# Patient Record
Sex: Female | Born: 1985 | Race: Black or African American | Hispanic: No | Marital: Single | Smoking: Never smoker
Health system: Southern US, Community
[De-identification: ages and names within clinical notes are randomized; demographics above are authoritative.]

## PROBLEM LIST (undated history)

## (undated) DIAGNOSIS — R87619 Unspecified abnormal cytological findings in specimens from cervix uteri: Secondary | ICD-10-CM

## (undated) DIAGNOSIS — R519 Headache, unspecified: Secondary | ICD-10-CM

## (undated) DIAGNOSIS — R011 Cardiac murmur, unspecified: Secondary | ICD-10-CM

## (undated) DIAGNOSIS — F419 Anxiety disorder, unspecified: Secondary | ICD-10-CM

## (undated) DIAGNOSIS — IMO0002 Reserved for concepts with insufficient information to code with codable children: Secondary | ICD-10-CM

## (undated) DIAGNOSIS — I10 Essential (primary) hypertension: Secondary | ICD-10-CM

## (undated) DIAGNOSIS — R51 Headache: Secondary | ICD-10-CM

## (undated) HISTORY — PX: WISDOM TOOTH EXTRACTION: SHX21

## (undated) HISTORY — PX: EYE SURGERY: SHX253

## (undated) HISTORY — DX: Unspecified abnormal cytological findings in specimens from cervix uteri: R87.619

## (undated) HISTORY — DX: Reserved for concepts with insufficient information to code with codable children: IMO0002

---

## 2012-11-03 ENCOUNTER — Other Ambulatory Visit: Payer: Self-pay | Admitting: Family Medicine

## 2012-11-03 ENCOUNTER — Encounter: Payer: Self-pay | Admitting: *Deleted

## 2012-11-03 ENCOUNTER — Emergency Department
Admission: EM | Admit: 2012-11-03 | Discharge: 2012-11-03 | Disposition: A | Discharge status: Home / Self Care | Payer: Self-pay | Source: Home / Self Care | Attending: Family Medicine | Admitting: Family Medicine

## 2012-11-03 DIAGNOSIS — Z331 Pregnant state, incidental: Secondary | ICD-10-CM

## 2012-11-03 DIAGNOSIS — N898 Other specified noninflammatory disorders of vagina: Secondary | ICD-10-CM

## 2012-11-03 HISTORY — DX: Essential (primary) hypertension: I10

## 2012-11-03 LAB — OB RESULTS CONSOLE GC/CHLAMYDIA
Chlamydia: NEGATIVE
Gonorrhea: NEGATIVE

## 2012-11-03 NOTE — ED Provider Notes (Signed)
CSN: [id number]     Arrival date & time 11/03/12  1215 History     First MD Initiated Contact with Patient 11/03/12 1250     Chief Complaint  Patient presents with  . Possible Pregnancy  . Vaginal Discharge      HPI Comments: Patient reports that she had a brief episode of lower abdominal pain about 3 weeks ago.  Over the past 3 weeks she has had a persistent vaginal discharge without pelvic pain.  She also recalls having had a tender nodule in her left labia that resolved.  She states that she has had persistent breast soreness for 3 weeks.  No urinary symptoms.  Patient's last menstrual period was 10/02/2012.   One week ago she developed mild nausea without vomiting, and states that she feels "full" in her lower abdomen.  Yesterday she noted a tinge of blood in her discharge now, resolved.  She performed two home pregnancy tests which were both positive yesterday. She is G2P2  Patient is a 27 y.o. female presenting with vaginal discharge. The history is provided by the patient.  Vaginal Discharge Quality:  White Severity:  Mild Onset quality:  Gradual Duration:  3 weeks Timing:  Constant Progression:  Unchanged Chronicity:  New Context: not recent antibiotic use   Relieved by:  Nothing Worsened by:  Nothing tried Ineffective treatments:  None tried Associated symptoms: nausea   Associated symptoms: no abdominal pain, no dysuria, no fever, no genital lesions, no rash, no urinary frequency, no urinary hesitancy, no urinary incontinence, no vaginal itching and no vomiting     Past Medical History  Diagnosis Date  . Hypertension    History reviewed. No pertinent past surgical history. Family History  Problem Relation Age of Onset  . Hypertension Father    History  Substance Use Topics  . Smoking status: Never Smoker   . Smokeless tobacco: Never Used  . Alcohol Use: No   OB History   Grav Para Term Preterm Abortions TAB SAB Ect Mult Living                 Review of  Systems  Constitutional: Negative for fever.  Gastrointestinal: Positive for nausea. Negative for vomiting and abdominal pain.  Genitourinary: Positive for vaginal discharge. Negative for bladder incontinence, dysuria and hesitancy.  All other systems reviewed and are negative.    Allergies  Review of patient's allergies indicates no known allergies.  Home Medications  No current outpatient prescriptions on file. BP 138/88  Pulse 94  Temp(Src) 98.2 F (36.8 C) (Oral)  Resp 16  Ht 5\' 3"  (1.6 m)  Wt 133 lb (60.328 kg)  BMI 23.57 kg/m2  SpO2 100%  LMP 10/02/2012 Physical Exam Nursing notes and Vital Signs reviewed. Appearance:  Patient appears healthy, stated age, and in no acute distress Eyes:  Pupils are equal, round, and reactive to light and accomodation.  Extraocular movement is intact.  Conjunctivae are not inflamed  Pharynx:  Normal Neck:  Supple.   No adenopathy Lungs:  Clear to auscultation.  Breath sounds are equal.  Heart:  Regular rate and rhythm without murmurs, rubs, or gallops.  Abdomen:  Nontender without masses or hepatosplenomegaly.  Bowel sounds are present.  No CVA or flank tenderness.  Note no pelvic tenderness Extremities:  No edema.  No calf tenderness Skin:  No rash present. Genitourinary:  Vulva appears normal without lesions or erythema.  Speculum exam without bimanual exam:  Vagina has normal mucosae without lesions and a  small amount of white discharge in the vaginal vault.  Cervix appears normal without lesions.  There is no discharge present in the cervical os.     ED Course   Procedures  none  Labs Reviewed  GC/CHLAMYDIA PROBE AMP, GENITAL pending  POCT KOH/wet prep:  Rare WBC, rare yeast, few clue cells, no trich, many epithelial cells  1. Vaginal discharge in pregnancy, first trimester     MDM  GC/chlamydia pending (specimen taken from vaginal vault in front of posterior fornix) Followup with Dr. Elsie Lincoln tomorrow. If symptoms become  significantly worse during the night or over the weekend, proceed to the local emergency room.   Lattie Haw, MD 11/03/12 1600

## 2012-11-03 NOTE — ED Notes (Signed)
Appt sch'ed with Dr. Penne Lash GYN for 11/04/12 @ 3:00pm. Pt notified of the appt/ [facility]

## 2012-11-03 NOTE — ED Notes (Signed)
Robin Erickson c/o pelvic pain 2 weeks ago that lasted for 2 weeks. Later developed a vaginal knot that has since resolved. She also c/o nausea and increased white vaginal discharge that was blood tinged yesterday. Taken 2 home pregnancy tests, both positive.

## 2012-11-04 ENCOUNTER — Encounter: Payer: Self-pay | Admitting: Obstetrics & Gynecology

## 2012-11-04 ENCOUNTER — Ambulatory Visit (INDEPENDENT_AMBULATORY_CARE_PROVIDER_SITE_OTHER): Payer: Medicaid Other | Admitting: Obstetrics & Gynecology

## 2012-11-04 VITALS — BP 127/92 | HR 88 | Resp 16 | Ht 63.0 in | Wt 133.0 lb

## 2012-11-04 DIAGNOSIS — Z331 Pregnant state, incidental: Secondary | ICD-10-CM | POA: Insufficient documentation

## 2012-11-04 DIAGNOSIS — Z3200 Encounter for pregnancy test, result unknown: Secondary | ICD-10-CM

## 2012-11-04 NOTE — Progress Notes (Signed)
  Subjective:    Patient ID: Vanna Scotland, female    DOB: 10-08-1985, 27 y.o.   MRN: [id number]  HPI 27 yo G3P2 female at approx 4 weeks 4 days EGA by LMP.   Pt has had pelvic cramping x 1 week with discharge.  Had some spotting yesterday and went to urgent care.  Cultures wer negative.  No work up regarding pain and pregnancy done.  Review of Systems    as above  Objective:   Physical Exam  Vitals reviewed. Constitutional: She appears well-developed and well-nourished.  HENT:  Head: Normocephalic and atraumatic.  Pulmonary/Chest: Effort normal.  Abdominal: Soft.  Genitourinary: Vagina normal and uterus normal.  No pain on bimanual, no adnexal mass  Musculoskeletal: She exhibits no edema.  Skin: Skin is warm and dry.  Psychiatric: She has a normal mood and affect.          Assessment & Plan:  27 yo female with mild pelvic pain approx 4 weeks   bhcg TV US

## 2012-11-05 ENCOUNTER — Ambulatory Visit (HOSPITAL_COMMUNITY)
Admission: RE | Admit: 2012-11-05 | Discharge: 2012-11-05 | Disposition: A | Discharge status: Home / Self Care | Payer: Self-pay | Source: Ambulatory Visit | Attending: Obstetrics & Gynecology | Admitting: Obstetrics & Gynecology

## 2012-11-05 ENCOUNTER — Telehealth: Payer: Self-pay | Admitting: *Deleted

## 2012-11-05 ENCOUNTER — Other Ambulatory Visit: Payer: Self-pay | Admitting: Obstetrics & Gynecology

## 2012-11-05 DIAGNOSIS — Z3689 Encounter for other specified antenatal screening: Secondary | ICD-10-CM | POA: Insufficient documentation

## 2012-11-05 DIAGNOSIS — Z3200 Encounter for pregnancy test, result unknown: Secondary | ICD-10-CM

## 2012-11-05 DIAGNOSIS — O209 Hemorrhage in early pregnancy, unspecified: Secondary | ICD-10-CM | POA: Insufficient documentation

## 2012-11-05 LAB — HCG, QUANTITATIVE, PREGNANCY: hCG, Beta Chain, Quant, S: 3755.7 m[IU]/mL

## 2012-11-05 NOTE — Telephone Encounter (Signed)
Spoke with patient and review BHCG levels.  She will repeat on Monday and she is being scheduled for a TVU to r/o ectopic pregnancy.

## 2012-11-08 ENCOUNTER — Other Ambulatory Visit (INDEPENDENT_AMBULATORY_CARE_PROVIDER_SITE_OTHER): Payer: Medicaid Other | Admitting: *Deleted

## 2012-11-08 ENCOUNTER — Telehealth: Payer: Self-pay | Admitting: *Deleted

## 2012-11-08 DIAGNOSIS — Z331 Pregnant state, incidental: Secondary | ICD-10-CM

## 2012-11-08 DIAGNOSIS — Z3200 Encounter for pregnancy test, result unknown: Secondary | ICD-10-CM

## 2012-11-08 NOTE — Telephone Encounter (Signed)
Message copied by Granville Lewis on Mon Nov 08, 2012  3:40 PM ------      Message from: Lesly Dukes      Created: Sun Nov 07, 2012  1:50 PM       Draw beta this Monday.  Let me know what it is.            thx sweet pea!            KHL ------

## 2012-11-08 NOTE — Telephone Encounter (Signed)
Pt notified  On Friday to have repeat BHCG today but pt did not show.  I called patient to remind her of her need for the repeat BHCG today.

## 2012-11-09 ENCOUNTER — Telehealth: Payer: Self-pay | Admitting: *Deleted

## 2012-11-09 DIAGNOSIS — Z3200 Encounter for pregnancy test, result unknown: Secondary | ICD-10-CM

## 2012-11-09 NOTE — Telephone Encounter (Signed)
Pt aware of BHCG results and per Dr Penne Lash repeat TVU in about 10 days.

## 2012-11-18 ENCOUNTER — Ambulatory Visit (HOSPITAL_COMMUNITY)
Admission: RE | Admit: 2012-11-18 | Discharge: 2012-11-18 | Disposition: A | Discharge status: Home / Self Care | Payer: Self-pay | Source: Ambulatory Visit | Attending: Obstetrics & Gynecology | Admitting: Obstetrics & Gynecology

## 2012-11-18 ENCOUNTER — Ambulatory Visit (HOSPITAL_COMMUNITY): Payer: Self-pay

## 2012-11-18 DIAGNOSIS — O3680X0 Pregnancy with inconclusive fetal viability, not applicable or unspecified: Secondary | ICD-10-CM | POA: Insufficient documentation

## 2012-11-18 DIAGNOSIS — Z3200 Encounter for pregnancy test, result unknown: Secondary | ICD-10-CM

## 2012-11-18 DIAGNOSIS — O209 Hemorrhage in early pregnancy, unspecified: Secondary | ICD-10-CM | POA: Insufficient documentation

## 2012-11-18 DIAGNOSIS — Z3689 Encounter for other specified antenatal screening: Secondary | ICD-10-CM | POA: Insufficient documentation

## 2012-12-17 ENCOUNTER — Encounter: Payer: Self-pay | Admitting: Advanced Practice Midwife

## 2012-12-17 ENCOUNTER — Ambulatory Visit (INDEPENDENT_AMBULATORY_CARE_PROVIDER_SITE_OTHER): Payer: Medicaid Other | Admitting: Advanced Practice Midwife

## 2012-12-17 VITALS — BP 144/84 | Wt 139.0 lb

## 2012-12-17 DIAGNOSIS — Z113 Encounter for screening for infections with a predominantly sexual mode of transmission: Secondary | ICD-10-CM

## 2012-12-17 DIAGNOSIS — O09899 Supervision of other high risk pregnancies, unspecified trimester: Secondary | ICD-10-CM | POA: Insufficient documentation

## 2012-12-17 DIAGNOSIS — Z1151 Encounter for screening for human papillomavirus (HPV): Secondary | ICD-10-CM

## 2012-12-17 DIAGNOSIS — Z1379 Encounter for other screening for genetic and chromosomal anomalies: Secondary | ICD-10-CM

## 2012-12-17 DIAGNOSIS — Z124 Encounter for screening for malignant neoplasm of cervix: Secondary | ICD-10-CM

## 2012-12-17 DIAGNOSIS — O09891 Supervision of other high risk pregnancies, first trimester: Secondary | ICD-10-CM

## 2012-12-17 DIAGNOSIS — Z23 Encounter for immunization: Secondary | ICD-10-CM

## 2012-12-17 DIAGNOSIS — O10919 Unspecified pre-existing hypertension complicating pregnancy, unspecified trimester: Secondary | ICD-10-CM | POA: Insufficient documentation

## 2012-12-17 DIAGNOSIS — O10911 Unspecified pre-existing hypertension complicating pregnancy, first trimester: Secondary | ICD-10-CM

## 2012-12-17 DIAGNOSIS — O210 Mild hyperemesis gravidarum: Secondary | ICD-10-CM

## 2012-12-17 DIAGNOSIS — G43009 Migraine without aura, not intractable, without status migrainosus: Secondary | ICD-10-CM | POA: Insufficient documentation

## 2012-12-17 DIAGNOSIS — O219 Vomiting of pregnancy, unspecified: Secondary | ICD-10-CM

## 2012-12-17 DIAGNOSIS — O10019 Pre-existing essential hypertension complicating pregnancy, unspecified trimester: Secondary | ICD-10-CM

## 2012-12-17 MED ORDER — LABETALOL HCL 200 MG PO TABS: 100.0000 mg | ORAL_TABLET | Freq: Two times a day (BID) | ORAL | Status: DC

## 2012-12-17 MED ORDER — PROMETHAZINE HCL 25 MG PO TABS: 12.5000 mg | ORAL_TABLET | Freq: Four times a day (QID) | ORAL | Status: DC | PRN

## 2012-12-17 MED ORDER — SUMATRIPTAN SUCCINATE 100 MG PO TABS: 100.0000 mg | ORAL_TABLET | Freq: Once | ORAL | Status: DC | PRN

## 2012-12-17 MED ORDER — INFLUENZA VAC SPLIT QUAD 0.5 ML IM SUSP
0.5000 mL | Freq: Once | INTRAMUSCULAR | Status: AC
  Administered 2012-12-17: 0.5 mL via INTRAMUSCULAR

## 2012-12-17 NOTE — Progress Notes (Signed)
p-88 

## 2012-12-17 NOTE — Progress Notes (Signed)
Subjective:    Robin Erickson is being seen today for her first obstetrical visit.  This is a planned pregnancy. She is at [redacted]w[redacted]d gestation. Her obstetrical history is significant for chronic hypertension. No Dx of Pre-eclampsia, but states she was induced at term for high BP. Patient does intend to breast feed. Pregnancy history fully reviewed.   Hx CHTN. Had been on HCTZ. Stopped for pregnancy.   Husband concerned about affect of his anti rejection medications on pregnancy.   Has had migraine before and during this pregnancy. Seen in [facility] for one a few weeks ago. Usually take Excedrin w/ good results. Told to stop during pregnancy. Triggered by and sometimes accompanied by N/V. Has Zofran Rx, but not covered. (?) Wants antiemetic.   Patient reports nausea, no bleeding, no contractions, no cramping and no leaking.  Migraines every few weeks. None now.   Review of Systems:   Review of Systems: Otherwise Neg  Objective:     BP 157/92  Wt 139 lb (63.05 kg)  BMI 24.63 kg/m2  LMP 10/02/2012 144/84 Physical Exam  Exam Physical Examination: General appearance - alert, well appearing, and in no distress, oriented to person, place, and time and normal appearing weight Mental status - normal mood, behavior, speech, dress, motor activity, and thought processes Eyes - pupils equal and reactive, extraocular eye movements intact, sclera anicteric Mouth - mucous membranes moist, pharynx normal without lesions and dental hygiene good Neck - supple, no significant adenopathy, thyroid exam: thyroid is normal in size without nodules or tenderness Heart - normal rate, regular rhythm, normal S1, S2, no murmurs, rubs, clicks or gallops Abdomen - soft, nontender, nondistended, no masses or organomegaly Pelvic - normal external genitalia, vulva, vagina, cervix, uterus and adnexa, PAP: Pap smear done today Neurological - alert, oriented, normal speech, no focal findings or movement disorder  noted Extremities - no pedal edema noted, Homan's sign negative bilaterally     Assessment:    Pregnancy: G3P2002  Migraine without aura - Plan: SUMAtriptan (IMITREX) 100 MG tablet, Cytology - PAP, Prenatal (OB Panel), HIV Antibody ( Reflex), Sickle Cell Scr, CULTURE, URINE COMPREHENSIVE, influenza vac split quadrivalent PF (FLUARIX) injection 0.5 mL, Prescript Monitor Profile(19), Alcohol metabolite (ETG), urine  Nausea and vomiting in pregnancy prior to [redacted] weeks gestation - Plan: promethazine (PHENERGAN) 25 MG tablet, Cytology - PAP, Prenatal (OB Panel), HIV Antibody ( Reflex), Sickle Cell Scr, CULTURE, URINE COMPREHENSIVE, influenza vac split quadrivalent PF (FLUARIX) injection 0.5 mL, Prescript Monitor Profile(19), Alcohol metabolite (ETG), urine  Genetic screening - Plan: US Fetal Nuchal Translucency Measurement, Genetic counseling, Cytology - PAP, Prenatal (OB Panel), HIV Antibody ( Reflex), Sickle Cell Scr, CULTURE, URINE COMPREHENSIVE, influenza vac split quadrivalent PF (FLUARIX) injection 0.5 mL, Prescript Monitor Profile(19), Alcohol metabolite (ETG), urine  Chronic hypertension in pregnancy, first trimester - Plan: labetalol (NORMODYNE) 100 MG tablet BID.   Plan:     Initial labs drawn. Prenatal vitamins. Problem list reviewed and updated. AFP3 discussed: requested. Role of ultrasound in pregnancy discussed; fetal survey: requested. Amniocentesis discussed: not indicated. Follow up in 1 week for BP check and 4 weeks for ROB. 75% of 30 min visit spent on counseling and coordination of care.  Needs baseline 24 hour urine.  Discussed antenatal testing, CNM/MD care choices.    Referred to [facility] for Migraine management. Informed pt that it is highly unlikely that Husband's meds would affect her pregnancy, but offered Genetic Counseling to discuss. Will decide.   Dorathy Kinsman 12/17/2012

## 2012-12-17 NOTE — Patient Instructions (Addendum)

## 2012-12-18 LAB — OBSTETRIC PANEL
Antibody Screen: NEGATIVE
Basophils Absolute: 0 10*3/uL (ref 0.0–0.1)
Basophils Relative: 0 % (ref 0–1)
Eosinophils Relative: 1 % (ref 0–5)
Hemoglobin: 12.4 g/dL (ref 12.0–15.0)
Lymphocytes Relative: 20 % (ref 12–46)
MCH: 31.1 pg (ref 26.0–34.0)
Monocytes Absolute: 0.8 10*3/uL (ref 0.1–1.0)
Neutro Abs: 11.2 10*3/uL — ABNORMAL HIGH (ref 1.7–7.7)
RBC: 3.99 MIL/uL (ref 3.87–5.11)
Rubella: 1.32 Index — ABNORMAL HIGH (ref <0.90)

## 2012-12-18 LAB — HIV ANTIBODY (ROUTINE TESTING W REFLEX): HIV: NONREACTIVE

## 2012-12-18 LAB — SICKLE CELL SCREEN: Sickle Cell Screen: NEGATIVE

## 2012-12-19 LAB — CULTURE, URINE COMPREHENSIVE
Colony Count: NO GROWTH
Organism ID, Bacteria: NO GROWTH

## 2012-12-20 LAB — PRESCRIPTION MONITORING PROFILE (19 PANEL)
Buprenorphine, Urine: NEGATIVE ng/mL
Cannabinoid Scrn, Ur: NEGATIVE ng/mL
Carisoprodol, Urine: NEGATIVE ng/mL
Cocaine Metabolites: NEGATIVE ng/mL
Creatinine, Urine: 103.4 mg/dL (ref >20.0)
Fentanyl, Ur: NEGATIVE ng/mL
MDMA URINE: NEGATIVE ng/mL
Meperidine, Ur: NEGATIVE ng/mL
Methadone Screen, Urine: NEGATIVE ng/mL
Methaqualone: NEGATIVE ng/mL
Oxycodone Screen, Ur: NEGATIVE ng/mL
Phencyclidine, Ur: NEGATIVE ng/mL
Tapentadol, urine: NEGATIVE ng/mL

## 2012-12-21 ENCOUNTER — Encounter: Payer: Self-pay | Admitting: Advanced Practice Midwife

## 2012-12-31 ENCOUNTER — Encounter (HOSPITAL_COMMUNITY): Payer: Self-pay

## 2012-12-31 ENCOUNTER — Other Ambulatory Visit: Payer: Self-pay

## 2012-12-31 ENCOUNTER — Ambulatory Visit (HOSPITAL_COMMUNITY)
Admission: RE | Admit: 2012-12-31 | Discharge: 2012-12-31 | Disposition: A | Discharge status: Home / Self Care | Payer: Medicaid Other | Source: Ambulatory Visit | Attending: Advanced Practice Midwife | Admitting: Advanced Practice Midwife

## 2012-12-31 ENCOUNTER — Ambulatory Visit (HOSPITAL_COMMUNITY)
Admission: RE | Admit: 2012-12-31 | Discharge: 2012-12-31 | Disposition: A | Discharge status: Home / Self Care | Payer: Medicaid Other | Source: Ambulatory Visit | Attending: Obstetrics and Gynecology | Admitting: Obstetrics and Gynecology

## 2012-12-31 DIAGNOSIS — Z1379 Encounter for other screening for genetic and chromosomal anomalies: Secondary | ICD-10-CM

## 2012-12-31 DIAGNOSIS — O3510X0 Maternal care for (suspected) chromosomal abnormality in fetus, unspecified, not applicable or unspecified: Secondary | ICD-10-CM | POA: Insufficient documentation

## 2012-12-31 DIAGNOSIS — O10019 Pre-existing essential hypertension complicating pregnancy, unspecified trimester: Secondary | ICD-10-CM | POA: Insufficient documentation

## 2012-12-31 DIAGNOSIS — Z3689 Encounter for other specified antenatal screening: Secondary | ICD-10-CM | POA: Insufficient documentation

## 2012-12-31 DIAGNOSIS — O351XX0 Maternal care for (suspected) chromosomal abnormality in fetus, not applicable or unspecified: Secondary | ICD-10-CM | POA: Insufficient documentation

## 2012-12-31 NOTE — Progress Notes (Signed)
Robin Erickson  was seen today for an ultrasound appointment.  See full report in [facility].  Impression: Single IUP at 12 6/7 weeks Normal NT (1.4 mm)  Nasal bone visualized First trimester aneuploidy screen performed as noted above.     Recommendations: Please do not draw triple/quad screen, though patient should be offered MSAFP for neural tube defect screening Recommend follow up in 6 weeks for fetal anatomy.  Alpha Gula, MD

## 2013-01-04 ENCOUNTER — Encounter: Payer: Self-pay | Admitting: Nurse Practitioner

## 2013-01-04 ENCOUNTER — Ambulatory Visit (INDEPENDENT_AMBULATORY_CARE_PROVIDER_SITE_OTHER): Payer: Medicaid Other | Admitting: Nurse Practitioner

## 2013-01-04 VITALS — BP 130/88 | Ht 63.0 in | Wt 144.0 lb

## 2013-01-04 DIAGNOSIS — O10019 Pre-existing essential hypertension complicating pregnancy, unspecified trimester: Secondary | ICD-10-CM

## 2013-01-04 DIAGNOSIS — O10911 Unspecified pre-existing hypertension complicating pregnancy, first trimester: Secondary | ICD-10-CM

## 2013-01-04 DIAGNOSIS — G43009 Migraine without aura, not intractable, without status migrainosus: Secondary | ICD-10-CM

## 2013-01-04 MED ORDER — ACETAMINOPHEN-CODEINE #3 300-30 MG PO TABS: 1.0000 | ORAL_TABLET | ORAL | Status: DC | PRN

## 2013-01-04 MED ORDER — PROMETHAZINE HCL 25 MG PO TABS: 25.0000 mg | ORAL_TABLET | Freq: Four times a day (QID) | ORAL | Status: DC | PRN

## 2013-01-04 NOTE — Progress Notes (Signed)
Diagnosis: Migraine without Aura  History: Robin Erickson 27 y.o. [id number] comes to [facility] office for migraine consultation.She is 13 weeks and 3 days pregnant. She has had migraine since she was 27 years old. Things have gotten worse in the last 5 years they have become more frequent. She is not working, has a 28 year old, 28 year old and this is an unexpected pregnancy. Money seems to be an issue. When she was not pregnant she could take an excedrin and it would make the headache go away. She has been seen in office and given Imitrex, phenergan and labetalol and not gotten any of these filled due to lack of insurance. She has chronic HTN.      Location: Can be either temple  Number of Headache days/month: Severe: 3 Moderate:2 Mild:0  Current Outpatient Prescriptions on File Prior to Visit  Medication Sig Dispense Refill  . labetalol (NORMODYNE) 200 MG tablet Take 0.5 tablets (100 mg total) by mouth 2 (two) times daily.  60 tablet  3  . SUMAtriptan (IMITREX) 100 MG tablet Take 1 tablet (100 mg total) by mouth once as needed for migraine. May repeat in 2 hours if headache persists or recurs.  10 tablet  2   No current facility-administered medications on file prior to visit.    Acute/ prevention: Excedrin ( not while pregnant ), tylenol  Past Medical History  Diagnosis Date  . Hypertension   . Abnormal Pap smear    Past Surgical History  Procedure Laterality Date  . Wisdom tooth extraction    . Eye surgery      age 89   Family History  Problem Relation Age of Onset  . Hypertension Father   . Diabetes Maternal Aunt   . Hypertension Mother   . Glaucoma Maternal Grandmother    Social History:  reports that she has never smoked. She has never used smokeless tobacco. She reports that she does not drink alcohol or use illicit drugs. Allergies: No Known Allergies  Triggers: Stress  Birth control: Pregnant  ROS: positive for migraine, nausea, knee pain, chronic hypertension    Exam: well developed, well nourished AA female  General: Pregnant/ NAD HEENT: negative Cardiac: RRR Lungs: Clear Neuro:Negative Skin: warm and dry  Impression:migraine - common  Plan: Discussed the pathophysiology of migraine and risks and benefits of medications in pregnancy. She is willing to assume risks. She was seen in office prior by [place] [facility] and given labetalol, Imitrex and phenergan. She did not get any of these filled as her insurance has not kicked in yet. It was explained these are likely $4 and she should get BP meds filled asap. Will add tylenol #3 until she can afford to get Imitrex filled.  She can return if this plan does not work out, or things get worse   Time Spent: 30 min

## 2013-01-04 NOTE — Patient Instructions (Signed)
Migraine Headache A migraine headache is an intense, throbbing pain on one or both sides of your head. A migraine can last for 30 minutes to several hours. CAUSES  The exact cause of a migraine headache is not always known. However, a migraine may be caused when nerves in the brain become irritated and release chemicals that cause inflammation. This causes pain. SYMPTOMS  Pain on one or both sides of your head.  Pulsating or throbbing pain.  Severe pain that prevents daily activities.  Pain that is aggravated by any physical activity.  Nausea, vomiting, or both.  Dizziness.  Pain with exposure to bright lights, loud noises, or activity.  General sensitivity to bright lights, loud noises, or smells. Before you get a migraine, you may get warning signs that a migraine is coming (aura). An aura may include:  Seeing flashing lights.  Seeing bright spots, halos, or zig-zag lines.  Having tunnel vision or blurred vision.  Having feelings of numbness or tingling.  Having trouble talking.  Having muscle weakness. MIGRAINE TRIGGERS  Alcohol.  Smoking.  Stress.  Menstruation.  Aged cheeses.  Foods or drinks that contain nitrates, glutamate, aspartame, or tyramine.  Lack of sleep.  Chocolate.  Caffeine.  Hunger.  Physical exertion.  Fatigue.  Medicines used to treat chest pain (nitroglycerine), birth control pills, estrogen, and some blood pressure medicines. DIAGNOSIS  A migraine headache is often diagnosed based on:  Symptoms.  Physical examination.  A CT scan or MRI of your head. TREATMENT Medicines may be given for pain and nausea. Medicines can also be given to help prevent recurrent migraines.  HOME CARE INSTRUCTIONS  Only take over-the-counter or prescription medicines for pain or discomfort as directed by your caregiver. The use of long-term narcotics is not recommended.  Lie down in a dark, quiet room when you have a migraine.  Keep a journal  to find out what may trigger your migraine headaches. For example, write down:  What you eat and drink.  How much sleep you get.  Any change to your diet or medicines.  Limit alcohol consumption.  Quit smoking if you smoke.  Get 7 to 9 hours of sleep, or as recommended by your caregiver.  Limit stress.  Keep lights dim if bright lights bother you and make your migraines worse. SEEK IMMEDIATE MEDICAL CARE IF:   Your migraine becomes severe.  You have a fever.  You have a stiff neck.  You have vision loss.  You have muscular weakness or loss of muscle control.  You start losing your balance or have trouble walking.  You feel faint or pass out.  You have severe symptoms that are different from your first symptoms. MAKE SURE YOU:   Understand these instructions.  Will watch your condition.  Will get help right away if you are not doing well or get worse. Document Released: 03/10/2005 Document Revised: 06/02/2011 Document Reviewed: 02/28/2011 [employer] Patient Information 2014 [employer].  

## 2013-01-11 ENCOUNTER — Encounter: Payer: Self-pay | Admitting: Obstetrics & Gynecology

## 2013-01-11 ENCOUNTER — Encounter: Payer: Self-pay | Admitting: *Deleted

## 2013-01-14 ENCOUNTER — Ambulatory Visit (INDEPENDENT_AMBULATORY_CARE_PROVIDER_SITE_OTHER): Payer: Medicaid Other | Admitting: Family

## 2013-01-14 VITALS — BP 140/90 | Wt 146.0 lb

## 2013-01-14 DIAGNOSIS — O09899 Supervision of other high risk pregnancies, unspecified trimester: Secondary | ICD-10-CM

## 2013-01-14 NOTE — Progress Notes (Signed)
p-88 

## 2013-01-14 NOTE — Progress Notes (Signed)
No questions or concerns; reviewed lab results, will return in two weeks for AFP, schedule anatomy US in 4 weeks.

## 2013-02-03 ENCOUNTER — Other Ambulatory Visit: Payer: Medicaid Other

## 2013-02-04 ENCOUNTER — Ambulatory Visit (INDEPENDENT_AMBULATORY_CARE_PROVIDER_SITE_OTHER): Payer: Medicaid Other | Admitting: Family

## 2013-02-04 VITALS — BP 144/101 | Temp 97.7°F | Wt 149.0 lb

## 2013-02-04 DIAGNOSIS — O139 Gestational [pregnancy-induced] hypertension without significant proteinuria, unspecified trimester: Secondary | ICD-10-CM

## 2013-02-04 DIAGNOSIS — J019 Acute sinusitis, unspecified: Secondary | ICD-10-CM

## 2013-02-04 DIAGNOSIS — O09899 Supervision of other high risk pregnancies, unspecified trimester: Secondary | ICD-10-CM

## 2013-02-04 DIAGNOSIS — O0990 Supervision of high risk pregnancy, unspecified, unspecified trimester: Secondary | ICD-10-CM

## 2013-02-04 NOTE — Progress Notes (Signed)
p-116  Feels like possible sinus infection since Wednesday but has not taken any OTC meds

## 2013-02-04 NOTE — Progress Notes (Signed)
Report right maxillary sinus pressure.  No fever, coughing and fever last week that has resolved.  Exam:  Frontal sinuses neg with palpation, right maxillary tender with palp. Throat - slightly red, no exudate.  Lungs CTAB. RX Amoxicillin 500 mg TID x 7 days.  AFP collected today.  Consulted with Dr. Jolayne Panther regarding elevated blood pressure > increase labetalol to tid > return for follow-up in two weeks.  Anatomy ultrasound scheduled.

## 2013-02-11 ENCOUNTER — Ambulatory Visit (HOSPITAL_COMMUNITY): Admission: RE | Admit: 2013-02-11 | Payer: Medicaid Other | Source: Ambulatory Visit

## 2013-02-11 ENCOUNTER — Ambulatory Visit (HOSPITAL_COMMUNITY)
Admission: RE | Admit: 2013-02-11 | Discharge: 2013-02-11 | Disposition: A | Discharge status: Home / Self Care | Payer: Medicaid Other | Source: Ambulatory Visit | Attending: Family | Admitting: Family

## 2013-02-11 DIAGNOSIS — Z3689 Encounter for other specified antenatal screening: Secondary | ICD-10-CM | POA: Insufficient documentation

## 2013-02-11 DIAGNOSIS — O09899 Supervision of other high risk pregnancies, unspecified trimester: Secondary | ICD-10-CM

## 2013-02-12 ENCOUNTER — Encounter: Payer: Self-pay | Admitting: Family

## 2013-02-16 ENCOUNTER — Encounter: Payer: Self-pay | Admitting: Family

## 2013-02-21 ENCOUNTER — Ambulatory Visit (INDEPENDENT_AMBULATORY_CARE_PROVIDER_SITE_OTHER): Payer: Medicaid Other | Admitting: Advanced Practice Midwife

## 2013-02-21 VITALS — BP 127/83 | Wt 154.0 lb

## 2013-02-21 DIAGNOSIS — O10912 Unspecified pre-existing hypertension complicating pregnancy, second trimester: Secondary | ICD-10-CM

## 2013-02-21 DIAGNOSIS — O09899 Supervision of other high risk pregnancies, unspecified trimester: Secondary | ICD-10-CM

## 2013-02-21 DIAGNOSIS — O09892 Supervision of other high risk pregnancies, second trimester: Secondary | ICD-10-CM

## 2013-02-21 DIAGNOSIS — O10019 Pre-existing essential hypertension complicating pregnancy, unspecified trimester: Secondary | ICD-10-CM

## 2013-02-21 NOTE — Progress Notes (Signed)
p-98 

## 2013-02-21 NOTE — Patient Instructions (Signed)
Hypertension During Pregnancy Hypertension is also called high blood pressure. It can occur at any time in life and during pregnancy. When you have hypertension, there is extra pressure inside your blood vessels that carry blood from the heart to the rest of your body (arteries). Hypertension during pregnancy can cause problems for you and your baby. Your baby might not weigh as much as it should at birth or might be born early (premature). Very bad cases of hypertension during pregnancy can be life-threatening.  There are different types of hypertension during pregnancy.   Chronic hypertension. This happens when a woman has hypertension before pregnancy and it continues during pregnancy.  Gestational hypertension. This is when hypertension develops during pregnancy.  Preeclampsia or toxemia of pregnancy. This is a very serious type of hypertension that develops only during pregnancy. It is a disease that affects the whole body (systemic) and can be very dangerous for both mother and baby.  Gestational hypertension and preeclampsia usually go away after your baby is born. Blood pressure generally stabilizes within 6 weeks. Women who have hypertension during pregnancy have a greater chance of developing hypertension later in life or with future pregnancies. UNDERSTANDING BLOOD PRESSURE Blood pressure moves blood in your body. Sometimes, the force that moves the blood becomes too strong.  A blood pressure reading is given in 2 numbers and looks like a fraction.  The top number is called the systolic pressure. When your heart beats, it forces more blood to flow through the arteries. Pressure inside the arteries goes up.  The bottom number is the diastolic pressure. Pressure goes down between beats. That is when the heart is resting.  You may have hypertension if:  Your systolic blood pressure is above 140.  Your diastolic pressure is above 90. RISK FACTORS Some factors make you more likely to  develop hypertension during pregnancy. Risk factors include:  Having hypertension before pregnancy.  Having hypertension during a previous pregnancy.  Being overweight.  Being older than 40.  Being pregnant with more than 1 baby (multiples).  Having diabetes or kidney problems. SYMPTOMS Chronic and gestational hypertension may not cause symptoms. Preeclampsia has symptoms, which may include:  Increased protein in your urine. Your caregiver will check for this at every prenatal visit.  Swelling of your hands and face.  Rapid weight gain.  Headaches.  Visual changes.  Being bothered by light.  Abdominal pain, especially in the right upper area.  Chest pain.  Shortness of breath.  Increased reflexes.  Seizures. Seizures occur with a more severe form of preeclampsia, called eclampsia. DIAGNOSIS   You may be diagnosed with hypertension during pregnancy during a regular prenatal exam. At each visit, tests may include:  Blood pressure checks.  A urine test to check for protein in your urine.  The type of hypertension you are diagnosed with depends on when you developed it. It also depends on your specific blood pressure reading.  Developing hypertension before 20 weeks of pregnancy is consistent with chronic hypertension.  Developing hypertension after 20 weeks of pregnancy is consistent with gestational hypertension.  Hypertension with increased urinary protein is diagnosed as preeclampsia.  Blood pressure measurements that stay above 160 systolic or 110 diastolic are a sign of severe preeclampsia. TREATMENT Treatment for hypertension during pregnancy varies. Treatment depends on the type of hypertension and how serious it is.  If you take medicine for chronic hypertension, you may need to switch medicines.  Drugs called ACE inhibitors should not be taken during pregnancy.    Low-dose aspirin may be suggested for women who have risk factors for preeclampsia.  If  you have gestational hypertension, you may need to take a blood pressure medicine that is safe during pregnancy. Your caregiver will recommend the appropriate medicine.  If you have severe preeclampsia, you may need to be in the hospital. Caregivers will watch you and the baby very closely. You also may need to take medicine (magnesium sulfate) to prevent seizures and lower blood pressure.  Sometimes an early delivery is needed. This may be the case if the condition worsens. It would be done to protect you and the baby. The only cure for preeclampsia is delivery. HOME CARE INSTRUCTIONS  Schedule and keep all of your regular prenatal care.  Follow your caregiver's instructions for taking medicines. Tell your caregiver about all medicines you take. This includes over-the-counter medicines.  Eat as little salt as possible.  Get regular exercise.  Do not drink alcohol.  Do not use tobacco products.  Do not drink products with caffeine.  Lie on your left side when resting.  Tell your doctor if you have any preeclampsia symptoms. SEEK IMMEDIATE MEDICAL CARE IF:  You have severe abdominal pain.  You have sudden swelling in the hands, ankles, or face.  You gain 4 pounds (1.8 kg) or more in 1 week.  You vomit repeatedly.  You have vaginal bleeding.  You do not feel the baby moving as much.  You have a headache.  You have blurred or double vision.  You have muscle twitching or spasms.  You have shortness of breath.  You have blue fingernails and lips.  You have blood in your urine. MAKE SURE YOU:  Understand these instructions.  Will watch your condition.  Will get help right away if you are not doing well. Document Released: 11/26/2010 Document Revised: 06/02/2011 Document Reviewed: 11/26/2010 [facility] Patient Information 2014 [place], [place].  Breastfeeding Deciding to breastfeed is one of the best choices you can make for you and your baby. A change in hormones  during pregnancy causes your breast tissue to grow and increases the number and size of your milk ducts. These hormones also allow proteins, sugars, and fats from your blood supply to make breast milk in your milk-producing glands. Hormones prevent breast milk from being released before your baby is born as well as prompt milk flow after birth. Once breastfeeding has begun, thoughts of your baby, as well as his or her sucking or crying, can stimulate the release of milk from your milk-producing glands.  BENEFITS OF BREASTFEEDING For Your Baby  Your first milk (colostrum) helps your baby's digestive system function better.   There are antibodies in your milk that help your baby fight off infections.   Your baby has a lower incidence of asthma, allergies, and sudden infant death syndrome.   The nutrients in breast milk are better for your baby than infant formulas and are designed uniquely for your baby's needs.   Breast milk improves your baby's brain development.   Your baby is less likely to develop other conditions, such as childhood obesity, asthma, or type 2 diabetes mellitus.  For You   Breastfeeding helps to create a very special bond between you and your baby.   Breastfeeding is convenient. Breast milk is always available at the correct temperature and costs nothing.   Breastfeeding helps to burn calories and helps you lose the weight gained during pregnancy.   Breastfeeding makes your uterus contract to its prepregnancy size faster and  slows bleeding (lochia) after you give birth.   Breastfeeding helps to lower your risk of developing type 2 diabetes mellitus, osteoporosis, and breast or ovarian cancer later in life. SIGNS THAT YOUR BABY IS HUNGRY Early Signs of Hunger  Increased alertness or activity.  Stretching.  Movement of the head from side to side.  Movement of the head and opening of the mouth when the corner of the mouth or cheek is stroked  (rooting).  Increased sucking sounds, smacking lips, cooing, sighing, or squeaking.  Hand-to-mouth movements.  Increased sucking of fingers or hands. Late Signs of Hunger  Fussing.  Intermittent crying. Extreme Signs of Hunger Signs of extreme hunger will require calming and consoling before your baby will be able to breastfeed successfully. Do not wait for the following signs of extreme hunger to occur before you initiate breastfeeding:   Restlessness.  A loud, strong cry.   Screaming. BREASTFEEDING BASICS Breastfeeding Initiation  Find a comfortable place to sit or lie down, with your neck and back well supported.  Place a pillow or rolled up blanket under your baby to bring him or her to the level of your breast (if you are seated). Nursing pillows are specially designed to help support your arms and your baby while you breastfeed.  Make sure that your baby's abdomen is facing your abdomen.   Gently massage your breast. With your fingertips, massage from your chest wall toward your nipple in a circular motion. This encourages milk flow. You may need to continue this action during the feeding if your milk flows slowly.  Support your breast with 4 fingers underneath and your thumb above your nipple. Make sure your fingers are well away from your nipple and your baby's mouth.   Stroke your baby's lips gently with your finger or nipple.   When your baby's mouth is open wide enough, quickly bring your baby to your breast, placing your entire nipple and as much of the colored area around your nipple (areola) as possible into your baby's mouth.   More areola should be visible above your baby's upper lip than below the lower lip.   Your baby's tongue should be between his or her lower gum and your breast.   Ensure that your baby's mouth is correctly positioned around your nipple (latched). Your baby's lips should create a seal on your breast and be turned out  (everted).  It is common for your baby to suck about 2 3 minutes in order to start the flow of breast milk. Latching Teaching your baby how to latch on to your breast properly is very important. An improper latch can cause nipple pain and decreased milk supply for you and poor weight gain in your baby. Also, if your baby is not latched onto your nipple properly, he or she may swallow some air during feeding. This can make your baby fussy. Burping your baby when you switch breasts during the feeding can help to get rid of the air. However, teaching your baby to latch on properly is still the best way to prevent fussiness from swallowing air while breastfeeding. Signs that your baby has successfully latched on to your nipple:    Silent tugging or silent sucking, without causing you pain.   Swallowing heard between every 3 4 sucks.    Muscle movement above and in front of his or her ears while sucking.  Signs that your baby has not successfully latched on to nipple:   Sucking sounds or smacking sounds  from your baby while breastfeeding.  Nipple pain. If you think your baby has not latched on correctly, slip your finger into the corner of your baby's mouth to break the suction and place it between your baby's gums. Attempt breastfeeding initiation again. Signs of Successful Breastfeeding Signs from your baby:   A gradual decrease in the number of sucks or complete cessation of sucking.   Falling asleep.   Relaxation of his or her body.   Retention of a small amount of milk in his or her mouth.   Letting go of your breast by himself or herself. Signs from you:  Breasts that have increased in firmness, weight, and size 1 3 hours after feeding.   Breasts that are softer immediately after breastfeeding.  Increased milk volume, as well as a change in milk consistency and color by the 5th day of breastfeeding.   Nipples that are not sore, cracked, or bleeding. Signs That Your  Pecola Leisure is Getting Enough Milk  Wetting at least 3 diapers in a 24-hour period. The urine should be clear and pale yellow by age 132 days.  At least 3 stools in a 24-hour period by age 132 days. The stool should be soft and yellow.  At least 3 stools in a 24-hour period by age 105 days. The stool should be seedy and yellow.  No loss of weight greater than 10% of birth weight during the first 32 days of age.  Average weight gain of 4 7 ounces (120 210 mL) per week after age 68 days.  Consistent daily weight gain by age 132 days, without weight loss after the age of 2 weeks. After a feeding, your baby may spit up a small amount. This is common. BREASTFEEDING FREQUENCY AND DURATION Frequent feeding will help you make more milk and can prevent sore nipples and breast engorgement. Breastfeed when you feel the need to reduce the fullness of your breasts or when your baby shows signs of hunger. This is called "breastfeeding on demand." Avoid introducing a pacifier to your baby while you are working to establish breastfeeding (the first 4 6 weeks after your baby is born). After this time you may choose to use a pacifier. Research has shown that pacifier use during the first year of a baby's life decreases the risk of sudden infant death syndrome (SIDS). Allow your baby to feed on each breast as long as he or she wants. Breastfeed until your baby is finished feeding. When your baby unlatches or falls asleep while feeding from the first breast, offer the second breast. Because newborns are often sleepy in the first few weeks of life, you may need to awaken your baby to get him or her to feed. Breastfeeding times will vary from baby to baby. However, the following rules can serve as a guide to help you ensure that your baby is properly fed:  Newborns (babies 18 weeks of age or younger) may breastfeed every 1 3 hours.  Newborns should not go longer than 3 hours during the day or 5 hours during the night without  breastfeeding.  You should breastfeed your baby a minimum of 8 times in a 24-hour period until you begin to introduce solid foods to your baby at around 22 months of age. BREAST MILK PUMPING Pumping and storing breast milk allows you to ensure that your baby is exclusively fed your breast milk, even at times when you are unable to breastfeed. This is especially important if you are going back  to work while you are still breastfeeding or when you are not able to be present during feedings. Your lactation consultant can give you guidelines on how long it is safe to store breast milk.  A breast pump is a machine that allows you to pump milk from your breast into a sterile bottle. The pumped breast milk can then be stored in a refrigerator or freezer. Some breast pumps are operated by hand, while others use electricity. Ask your lactation consultant which type will work best for you. Breast pumps can be purchased, but some hospitals and breastfeeding support groups lease breast pumps on a monthly basis. A lactation consultant can teach you how to hand express breast milk, if you prefer not to use a pump.  CARING FOR YOUR BREASTS WHILE YOU BREASTFEED Nipples can become dry, cracked, and sore while breastfeeding. The following recommendations can help keep your breasts moisturized and healthy:  Avoid using soap on your nipples.   Wear a supportive bra. Although not required, special nursing bras and tank tops are designed to allow access to your breasts for breastfeeding without taking off your entire bra or top. Avoid wearing underwire style bras or extremely tight bras.  Air dry your nipples for 3 after each feeding.   Use only cotton bra pads to absorb leaked breast milk. Leaking of breast milk between feedings is normal.   Use lanolin on your nipples after breastfeeding. Lanolin helps to maintain your skin's normal moisture barrier. If you use pure lanolin you do not need to wash it off  before feeding your baby again. Pure lanolin is not toxic to your baby. You may also hand express a few drops of breast milk and gently massage that milk into your nipples and allow the milk to air dry. In the first few weeks after giving birth, some women experience extremely full breasts (engorgement). Engorgement can make your breasts feel heavy, warm, and tender to the touch. Engorgement peaks within 3 5 days after you give birth. The following recommendations can help ease engorgement:  Completely empty your breasts while breastfeeding or pumping. You may want to start by applying warm, moist heat (in the shower or with warm water-soaked hand towels) just before feeding or pumping. This increases circulation and helps the milk flow. If your baby does not completely empty your breasts while breastfeeding, pump any extra milk after he or she is finished.  Wear a snug bra (nursing or regular) or tank top for 1 2 days to signal your body to slightly decrease milk production.  Apply ice packs to your breasts, unless this is too uncomfortable for you.  Make sure that your baby is latched on and positioned properly while breastfeeding. If engorgement persists after 48 hours of following these recommendations, contact your health care provider or a Advertising copywriter. OVERALL HEALTH CARE RECOMMENDATIONS WHILE BREASTFEEDING  Eat healthy foods. Alternate between meals and snacks, eating 3 of each per day. Because what you eat affects your breast milk, some of the foods may make your baby more irritable than usual. Avoid eating these foods if you are sure that they are negatively affecting your baby.  Drink milk, fruit juice, and water to satisfy your thirst (about 10 glasses a day).   Rest often, relax, and continue to take your prenatal vitamins to prevent fatigue, stress, and anemia.  Continue breast self-awareness checks.  Avoid chewing and smoking tobacco.  Avoid alcohol and drug use. Some  medicines that may be harmful  to your baby can pass through breast milk. It is important to ask your health care provider before taking any medicine, including all over-the-counter and prescription medicine as well as vitamin and herbal supplements. It is possible to become pregnant while breastfeeding. If birth control is desired, ask your health care provider about options that will be safe for your baby. SEEK MEDICAL CARE IF:   You feel like you want to stop breastfeeding or have become frustrated with breastfeeding.  You have painful breasts or nipples.  Your nipples are cracked or bleeding.  Your breasts are red, tender, or warm.  You have a swollen area on either breast.  You have a fever or chills.  You have nausea or vomiting.  You have drainage other than breast milk from your nipples.  Your breasts do not become full before feedings by the 5th day after you give birth.  You feel sad and depressed.  Your baby is too sleepy to eat well.  Your baby is having trouble sleeping.   Your baby is wetting less than 3 diapers in a 24-hour period.  Your baby has less than 3 stools in a 24-hour period.  Your baby's skin or the white part of his or her eyes becomes yellow.   Your baby is not gaining weight by 40 days of age. SEEK IMMEDIATE MEDICAL CARE IF:   Your baby is overly tired (lethargic) and does not want to wake up and feed.  Your baby develops an unexplained fever. Document Released: 03/10/2005 Document Revised: 11/10/2012 Document Reviewed: 09/01/2012 [facility] Patient Information 2014 [place], [place].

## 2013-02-21 NOTE — Progress Notes (Signed)
AFP neg. Anatomy normal, but incomplete. F/U scheduled. BP much better.

## 2013-02-27 ENCOUNTER — Other Ambulatory Visit: Payer: Self-pay | Admitting: Advanced Practice Midwife

## 2013-02-27 DIAGNOSIS — O10912 Unspecified pre-existing hypertension complicating pregnancy, second trimester: Secondary | ICD-10-CM

## 2013-02-28 ENCOUNTER — Other Ambulatory Visit: Payer: Self-pay | Admitting: *Deleted

## 2013-02-28 ENCOUNTER — Telehealth: Payer: Self-pay | Admitting: *Deleted

## 2013-02-28 ENCOUNTER — Other Ambulatory Visit: Payer: Medicaid Other

## 2013-02-28 DIAGNOSIS — O132 Gestational [pregnancy-induced] hypertension without significant proteinuria, second trimester: Secondary | ICD-10-CM

## 2013-02-28 DIAGNOSIS — O10912 Unspecified pre-existing hypertension complicating pregnancy, second trimester: Secondary | ICD-10-CM

## 2013-02-28 NOTE — Telephone Encounter (Signed)
Pt notified to return to office for a  Baseline 24 hour urine.

## 2013-03-03 LAB — CREATININE CLEARANCE, URINE, 24 HOUR: Creatinine, Urine: 112.5 mg/dL

## 2013-03-03 LAB — PROTEIN, URINE, 24 HOUR
Protein, 24H Urine: 77 mg/d (ref 50–100)
Protein, Urine: 7 mg/dL

## 2013-03-04 ENCOUNTER — Encounter: Payer: Self-pay | Admitting: Advanced Practice Midwife

## 2013-03-09 ENCOUNTER — Encounter: Payer: Self-pay | Admitting: Advanced Practice Midwife

## 2013-03-10 NOTE — Addendum Note (Signed)
Addended by: Arne Cleveland on: 03/10/2013 01:39 PM   Modules accepted: Orders

## 2013-03-11 LAB — CREATININE, SERUM: Creat: 0.46 mg/dL — ABNORMAL LOW (ref 0.50–1.10)

## 2013-03-21 ENCOUNTER — Ambulatory Visit (HOSPITAL_COMMUNITY)
Admission: RE | Admit: 2013-03-21 | Discharge: 2013-03-21 | Disposition: A | Discharge status: Home / Self Care | Payer: Medicaid Other | Source: Ambulatory Visit | Attending: Advanced Practice Midwife | Admitting: Advanced Practice Midwife

## 2013-03-21 VITALS — BP 126/89 | HR 123 | Wt 155.0 lb

## 2013-03-21 DIAGNOSIS — Z3689 Encounter for other specified antenatal screening: Secondary | ICD-10-CM | POA: Insufficient documentation

## 2013-03-21 DIAGNOSIS — O10019 Pre-existing essential hypertension complicating pregnancy, unspecified trimester: Secondary | ICD-10-CM | POA: Insufficient documentation

## 2013-03-21 DIAGNOSIS — O09892 Supervision of other high risk pregnancies, second trimester: Secondary | ICD-10-CM

## 2013-03-23 ENCOUNTER — Encounter: Payer: Self-pay | Admitting: Obstetrics & Gynecology

## 2013-03-23 ENCOUNTER — Ambulatory Visit (INDEPENDENT_AMBULATORY_CARE_PROVIDER_SITE_OTHER): Payer: Medicaid Other | Admitting: Obstetrics & Gynecology

## 2013-03-23 VITALS — BP 131/95 | Wt 158.0 lb

## 2013-03-23 DIAGNOSIS — J329 Chronic sinusitis, unspecified: Secondary | ICD-10-CM

## 2013-03-23 DIAGNOSIS — O10912 Unspecified pre-existing hypertension complicating pregnancy, second trimester: Secondary | ICD-10-CM

## 2013-03-23 DIAGNOSIS — O10019 Pre-existing essential hypertension complicating pregnancy, unspecified trimester: Secondary | ICD-10-CM

## 2013-03-23 DIAGNOSIS — Z3480 Encounter for supervision of other normal pregnancy, unspecified trimester: Secondary | ICD-10-CM

## 2013-03-23 MED ORDER — AZITHROMYCIN 250 MG PO TABS: ORAL_TABLET | ORAL | Status: DC

## 2013-03-23 NOTE — Patient Instructions (Signed)
Return to clinic for any obstetric concerns or go to [facility] for evaluation  

## 2013-03-23 NOTE — Progress Notes (Signed)
p-113 

## 2013-03-23 NOTE — Progress Notes (Signed)
Z pack prescribed for persistent sinusitis.  Normal anatomy scan, next growth scan already scheduled.  1 hr GTT, labs, Tdap next visit. No other complaints or concerns.  Preeclampsia, fetal movement and labor precautions reviewed.

## 2013-03-24 NOTE — L&D Delivery Note (Signed)
Delivery Note At 6:38 PM a viable female was delivered via Vaginal, Spontaneous Delivery (Presentation: ; Occiput Anterior).  APGAR: 3, 5; weight 3 lb 15.1 oz (1790 g).   Placenta status: Intact, Spontaneous.  Cord: 3 vessels with the following complications: None.   Precipitous delivery attended by L&D nurse without complications Anesthesia: Epidural  Episiotomy: None Lacerations: None Suture Repair: n/a Est. Blood Loss (mL): 350  Mom to postpartum.  Baby to NICU.  Madysin Crisp 05/28/2013, 7:33 PM

## 2013-03-25 ENCOUNTER — Encounter: Payer: Self-pay | Admitting: Advanced Practice Midwife

## 2013-04-18 ENCOUNTER — Ambulatory Visit (HOSPITAL_COMMUNITY)
Admission: RE | Admit: 2013-04-18 | Discharge: 2013-04-18 | Disposition: A | Discharge status: Home / Self Care | Payer: Medicaid Other | Source: Ambulatory Visit | Attending: Obstetrics & Gynecology | Admitting: Obstetrics & Gynecology

## 2013-04-18 DIAGNOSIS — O09892 Supervision of other high risk pregnancies, second trimester: Secondary | ICD-10-CM

## 2013-04-18 DIAGNOSIS — O10019 Pre-existing essential hypertension complicating pregnancy, unspecified trimester: Secondary | ICD-10-CM | POA: Insufficient documentation

## 2013-04-22 ENCOUNTER — Ambulatory Visit (INDEPENDENT_AMBULATORY_CARE_PROVIDER_SITE_OTHER): Payer: Medicaid Other | Admitting: Family

## 2013-04-22 ENCOUNTER — Encounter: Payer: Self-pay | Admitting: Family

## 2013-04-22 VITALS — BP 141/97 | Wt 164.0 lb

## 2013-04-22 DIAGNOSIS — Z23 Encounter for immunization: Secondary | ICD-10-CM

## 2013-04-22 DIAGNOSIS — O10019 Pre-existing essential hypertension complicating pregnancy, unspecified trimester: Secondary | ICD-10-CM

## 2013-04-22 DIAGNOSIS — O09899 Supervision of other high risk pregnancies, unspecified trimester: Secondary | ICD-10-CM

## 2013-04-22 DIAGNOSIS — O10919 Unspecified pre-existing hypertension complicating pregnancy, unspecified trimester: Secondary | ICD-10-CM

## 2013-04-22 DIAGNOSIS — Z3480 Encounter for supervision of other normal pregnancy, unspecified trimester: Secondary | ICD-10-CM

## 2013-04-22 LAB — CBC
HCT: 31.3 % — ABNORMAL LOW (ref 36.0–46.0)
Hemoglobin: 10.6 g/dL — ABNORMAL LOW (ref 12.0–15.0)
MCH: 29.7 pg (ref 26.0–34.0)
MCHC: 33.9 g/dL (ref 30.0–36.0)
MCV: 87.7 fL (ref 78.0–100.0)
Platelets: 355 K/uL (ref 150–400)
RBC: 3.57 MIL/uL — ABNORMAL LOW (ref 3.87–5.11)
RDW: 15 % (ref 11.5–15.5)
WBC: 14.6 K/uL — ABNORMAL HIGH (ref 4.0–10.5)

## 2013-04-22 MED ORDER — FAMOTIDINE 40 MG PO TABS: 40.0000 mg | ORAL_TABLET | Freq: Every day | ORAL | Status: DC

## 2013-04-22 NOTE — Progress Notes (Signed)
Reports increased swelling in hands and feet; no report of headache or vision changes.  + reflux after eating heavy fat food/acidic food.  Discussed dietary changes. RX Pepcid if not improvement.  Keep BP medication at TID.  Reviewed ultrasound results (55%ile growth) at 28 wks.  Next ultrasound in 4 weeks.

## 2013-04-22 NOTE — Progress Notes (Signed)
p=106 

## 2013-04-23 LAB — HIV ANTIBODY (ROUTINE TESTING W REFLEX): HIV: NONREACTIVE

## 2013-04-23 LAB — GLUCOSE TOLERANCE, 1 HOUR (50G) W/O FASTING: Glucose, 1 Hour GTT: 94 mg/dL (ref 70–140)

## 2013-04-23 LAB — RPR

## 2013-04-25 ENCOUNTER — Telehealth: Payer: Self-pay | Admitting: *Deleted

## 2013-04-25 NOTE — Telephone Encounter (Signed)
Pt notified of normal 1 hr GTT and that her Hgb was slightly decreased and to make sure whe was taking her PNV as directed.

## 2013-04-29 ENCOUNTER — Encounter: Payer: Self-pay | Admitting: Family

## 2013-05-06 ENCOUNTER — Encounter: Payer: Self-pay | Admitting: Advanced Practice Midwife

## 2013-05-06 ENCOUNTER — Ambulatory Visit (INDEPENDENT_AMBULATORY_CARE_PROVIDER_SITE_OTHER): Payer: Medicaid Other | Admitting: Advanced Practice Midwife

## 2013-05-06 VITALS — BP 155/103 | Wt 168.0 lb

## 2013-05-06 DIAGNOSIS — O10919 Unspecified pre-existing hypertension complicating pregnancy, unspecified trimester: Secondary | ICD-10-CM

## 2013-05-06 DIAGNOSIS — O10019 Pre-existing essential hypertension complicating pregnancy, unspecified trimester: Secondary | ICD-10-CM

## 2013-05-06 DIAGNOSIS — Z3480 Encounter for supervision of other normal pregnancy, unspecified trimester: Secondary | ICD-10-CM

## 2013-05-06 LAB — COMPREHENSIVE METABOLIC PANEL
AST: 14 U/L (ref 0–37)
Albumin: 3.2 g/dL — ABNORMAL LOW (ref 3.5–5.2)
Alkaline Phosphatase: 79 U/L (ref 39–117)
BILIRUBIN TOTAL: 0.2 mg/dL (ref 0.2–1.2)
BUN: 5 mg/dL — ABNORMAL LOW (ref 6–23)
CALCIUM: 10 mg/dL (ref 8.4–10.5)
CHLORIDE: 104 meq/L (ref 96–112)
CO2: 23 mEq/L (ref 19–32)
Creat: 0.54 mg/dL (ref 0.50–1.10)
Glucose, Bld: 77 mg/dL (ref 70–99)
Potassium: 3.9 mEq/L (ref 3.5–5.3)
SODIUM: 136 meq/L (ref 135–145)
TOTAL PROTEIN: 6.4 g/dL (ref 6.0–8.3)

## 2013-05-06 LAB — CBC
HCT: 29.9 % — ABNORMAL LOW (ref 36.0–46.0)
Hemoglobin: 10.4 g/dL — ABNORMAL LOW (ref 12.0–15.0)
MCH: 30.1 pg (ref 26.0–34.0)
MCHC: 34.8 g/dL (ref 30.0–36.0)
MCV: 86.7 fL (ref 78.0–100.0)
PLATELETS: 351 10*3/uL (ref 150–400)
RBC: 3.45 MIL/uL — AB (ref 3.87–5.11)
RDW: 14.9 % (ref 11.5–15.5)
WBC: 13.5 10*3/uL — AB (ref 4.0–10.5)

## 2013-05-06 MED ORDER — LABETALOL HCL 200 MG PO TABS: 300.0000 mg | ORAL_TABLET | Freq: Three times a day (TID) | ORAL | Status: DC

## 2013-05-06 NOTE — Progress Notes (Signed)
Doing well.  Good fetal movement, denies vaginal bleeding, LOF, regular contractions. Denies h/a, epigastric pain or visual disturbances.  Consult Dr Macon LargeAnyanwu.  Increase labetalol to 300 mg TID, CBC, CMP, protein/creatinine ratio today.

## 2013-05-06 NOTE — Progress Notes (Signed)
p-84  BP repeat 146/105

## 2013-05-07 LAB — PROTEIN / CREATININE RATIO, URINE
Creatinine, Urine: 99.1 mg/dL
Protein Creatinine Ratio: 0.14 (ref <0.15)
Total Protein, Urine: 14 mg/dL

## 2013-05-13 ENCOUNTER — Other Ambulatory Visit: Payer: Self-pay | Admitting: Obstetrics & Gynecology

## 2013-05-13 DIAGNOSIS — O10919 Unspecified pre-existing hypertension complicating pregnancy, unspecified trimester: Secondary | ICD-10-CM

## 2013-05-16 ENCOUNTER — Other Ambulatory Visit: Payer: Self-pay | Admitting: Obstetrics & Gynecology

## 2013-05-16 ENCOUNTER — Ambulatory Visit (HOSPITAL_COMMUNITY)
Admission: RE | Admit: 2013-05-16 | Discharge: 2013-05-16 | Disposition: A | Discharge status: Home / Self Care | Payer: Medicaid Other | Source: Ambulatory Visit | Attending: Obstetrics & Gynecology | Admitting: Obstetrics & Gynecology

## 2013-05-16 DIAGNOSIS — O10019 Pre-existing essential hypertension complicating pregnancy, unspecified trimester: Secondary | ICD-10-CM | POA: Insufficient documentation

## 2013-05-16 DIAGNOSIS — O289 Unspecified abnormal findings on antenatal screening of mother: Secondary | ICD-10-CM | POA: Insufficient documentation

## 2013-05-16 DIAGNOSIS — O10919 Unspecified pre-existing hypertension complicating pregnancy, unspecified trimester: Secondary | ICD-10-CM

## 2013-05-17 ENCOUNTER — Ambulatory Visit (HOSPITAL_COMMUNITY): Payer: Medicaid Other

## 2013-05-18 ENCOUNTER — Other Ambulatory Visit: Payer: Self-pay | Admitting: Obstetrics & Gynecology

## 2013-05-18 DIAGNOSIS — O10919 Unspecified pre-existing hypertension complicating pregnancy, unspecified trimester: Secondary | ICD-10-CM

## 2013-05-23 ENCOUNTER — Encounter (HOSPITAL_COMMUNITY): Payer: Self-pay

## 2013-05-23 ENCOUNTER — Ambulatory Visit (HOSPITAL_COMMUNITY)
Admission: RE | Admit: 2013-05-23 | Discharge: 2013-05-23 | Disposition: A | Discharge status: Home / Self Care | Payer: Medicaid Other | Source: Ambulatory Visit | Attending: Obstetrics & Gynecology | Admitting: Obstetrics & Gynecology

## 2013-05-23 ENCOUNTER — Encounter: Payer: Medicaid Other | Admitting: Advanced Practice Midwife

## 2013-05-23 VITALS — BP 151/103 | HR 101 | Wt 168.0 lb

## 2013-05-23 VITALS — BP 146/97

## 2013-05-23 DIAGNOSIS — O10919 Unspecified pre-existing hypertension complicating pregnancy, unspecified trimester: Secondary | ICD-10-CM

## 2013-05-23 DIAGNOSIS — O09899 Supervision of other high risk pregnancies, unspecified trimester: Secondary | ICD-10-CM

## 2013-05-23 DIAGNOSIS — O10019 Pre-existing essential hypertension complicating pregnancy, unspecified trimester: Secondary | ICD-10-CM | POA: Insufficient documentation

## 2013-05-24 ENCOUNTER — Other Ambulatory Visit: Payer: Self-pay | Admitting: Obstetrics & Gynecology

## 2013-05-24 DIAGNOSIS — O10919 Unspecified pre-existing hypertension complicating pregnancy, unspecified trimester: Secondary | ICD-10-CM

## 2013-05-27 ENCOUNTER — Inpatient Hospital Stay (HOSPITAL_COMMUNITY)
Admission: AD | Admit: 2013-05-27 | Discharge: 2013-05-30 | DRG: 774 | Disposition: A | Discharge status: Home / Self Care | Payer: Medicaid Other | Source: Ambulatory Visit | Attending: Obstetrics & Gynecology | Admitting: Obstetrics & Gynecology

## 2013-05-27 ENCOUNTER — Inpatient Hospital Stay (HOSPITAL_COMMUNITY): Payer: Medicaid Other

## 2013-05-27 ENCOUNTER — Encounter: Payer: Self-pay | Admitting: Advanced Practice Midwife

## 2013-05-27 ENCOUNTER — Encounter (HOSPITAL_COMMUNITY): Payer: Self-pay | Admitting: *Deleted

## 2013-05-27 ENCOUNTER — Ambulatory Visit (INDEPENDENT_AMBULATORY_CARE_PROVIDER_SITE_OTHER): Payer: Medicaid Other | Admitting: Advanced Practice Midwife

## 2013-05-27 VITALS — BP 151/105 | Wt 166.0 lb

## 2013-05-27 DIAGNOSIS — O09899 Supervision of other high risk pregnancies, unspecified trimester: Secondary | ICD-10-CM

## 2013-05-27 DIAGNOSIS — O10919 Unspecified pre-existing hypertension complicating pregnancy, unspecified trimester: Secondary | ICD-10-CM

## 2013-05-27 DIAGNOSIS — Z3480 Encounter for supervision of other normal pregnancy, unspecified trimester: Secondary | ICD-10-CM

## 2013-05-27 DIAGNOSIS — O119 Pre-existing hypertension with pre-eclampsia, unspecified trimester: Secondary | ICD-10-CM

## 2013-05-27 DIAGNOSIS — IMO0002 Reserved for concepts with insufficient information to code with codable children: Principal | ICD-10-CM | POA: Diagnosis present

## 2013-05-27 DIAGNOSIS — O10019 Pre-existing essential hypertension complicating pregnancy, unspecified trimester: Secondary | ICD-10-CM

## 2013-05-27 DIAGNOSIS — O1410 Severe pre-eclampsia, unspecified trimester: Secondary | ICD-10-CM

## 2013-05-27 DIAGNOSIS — I10 Essential (primary) hypertension: Secondary | ICD-10-CM | POA: Diagnosis present

## 2013-05-27 DIAGNOSIS — D649 Anemia, unspecified: Secondary | ICD-10-CM | POA: Diagnosis not present

## 2013-05-27 DIAGNOSIS — O9903 Anemia complicating the puerperium: Secondary | ICD-10-CM | POA: Diagnosis not present

## 2013-05-27 LAB — RAPID HIV SCREEN (WH-MAU): Rapid HIV Screen: NONREACTIVE

## 2013-05-27 LAB — TYPE AND SCREEN
ABO/RH(D): O POS
Antibody Screen: NEGATIVE

## 2013-05-27 LAB — OB RESULTS CONSOLE GBS: STREP GROUP B AG: NEGATIVE

## 2013-05-27 LAB — PROTEIN / CREATININE RATIO, URINE
Creatinine, Urine: 236.37 mg/dL
PROTEIN CREATININE RATIO: 0.62 — AB (ref 0.00–0.15)
Total Protein, Urine: 147.2 mg/dL

## 2013-05-27 LAB — COMPREHENSIVE METABOLIC PANEL
ALT: 7 U/L (ref 0–35)
AST: 17 U/L (ref 0–37)
Albumin: 2.7 g/dL — ABNORMAL LOW (ref 3.5–5.2)
Alkaline Phosphatase: 103 U/L (ref 39–117)
BUN: 12 mg/dL (ref 6–23)
CHLORIDE: 103 meq/L (ref 96–112)
CO2: 19 meq/L (ref 19–32)
CREATININE: 0.59 mg/dL (ref 0.50–1.10)
Calcium: 10.9 mg/dL — ABNORMAL HIGH (ref 8.4–10.5)
Glucose, Bld: 83 mg/dL (ref 70–99)
Potassium: 4.2 mEq/L (ref 3.7–5.3)
Sodium: 138 mEq/L (ref 137–147)
Total Protein: 7.5 g/dL (ref 6.0–8.3)

## 2013-05-27 LAB — CBC
HCT: 31.5 % — ABNORMAL LOW (ref 36.0–46.0)
Hemoglobin: 10.8 g/dL — ABNORMAL LOW (ref 12.0–15.0)
MCH: 29.8 pg (ref 26.0–34.0)
MCHC: 34.3 g/dL (ref 30.0–36.0)
MCV: 86.8 fL (ref 78.0–100.0)
Platelets: 348 10*3/uL (ref 150–400)
RBC: 3.63 MIL/uL — ABNORMAL LOW (ref 3.87–5.11)
RDW: 13.9 % (ref 11.5–15.5)
WBC: 14.1 10*3/uL — AB (ref 4.0–10.5)

## 2013-05-27 LAB — GROUP B STREP BY PCR: Group B strep by PCR: NEGATIVE

## 2013-05-27 LAB — ABO/RH: ABO/RH(D): O POS

## 2013-05-27 MED ORDER — LACTATED RINGERS IV SOLN
INTRAVENOUS | Status: DC
  Administered 2013-05-28: 16:00:00 via INTRAVENOUS
  Administered 2013-05-28: 1000 mL via INTRAVENOUS

## 2013-05-27 MED ORDER — PRENATAL MULTIVITAMIN CH: 1.0000 | ORAL_TABLET | Freq: Every day | ORAL | Status: DC

## 2013-05-27 MED ORDER — CALCIUM CARBONATE ANTACID 500 MG PO CHEW: 2.0000 | CHEWABLE_TABLET | ORAL | Status: DC | PRN

## 2013-05-27 MED ORDER — LACTATED RINGERS IV BOLUS (SEPSIS)
500.0000 mL | Freq: Once | INTRAVENOUS | Status: AC
  Administered 2013-05-27: 1000 mL via INTRAVENOUS

## 2013-05-27 MED ORDER — MISOPROSTOL 25 MCG QUARTER TABLET
25.0000 ug | ORAL_TABLET | ORAL | Status: DC | PRN
  Administered 2013-05-27: 25 ug via VAGINAL

## 2013-05-27 MED ORDER — LABETALOL HCL 300 MG PO TABS
300.0000 mg | ORAL_TABLET | Freq: Three times a day (TID) | ORAL | Status: DC
  Administered 2013-05-27 – 2013-05-30 (×8): 300 mg via ORAL
  Filled 2013-05-27 (×9): qty 1

## 2013-05-27 MED ORDER — ONDANSETRON HCL 4 MG/2ML IJ SOLN
4.0000 mg | Freq: Four times a day (QID) | INTRAMUSCULAR | Status: DC | PRN
Start: 2013-05-27 — End: 2013-05-28
  Administered 2013-05-28: 4 mg via INTRAVENOUS
  Filled 2013-05-27 (×2): qty 2

## 2013-05-27 MED ORDER — MAGNESIUM SULFATE 40 G IN LACTATED RINGERS - SIMPLE
2.0000 g/h | INTRAVENOUS | Status: AC
  Administered 2013-05-27 – 2013-05-29 (×3): 2 g/h via INTRAVENOUS
  Filled 2013-05-27 (×3): qty 500

## 2013-05-27 MED ORDER — BETAMETHASONE SOD PHOS & ACET 6 (3-3) MG/ML IJ SUSP
12.0000 mg | Freq: Once | INTRAMUSCULAR | Status: DC
  Administered 2013-05-27: 12 mg via INTRAMUSCULAR
  Filled 2013-05-27: qty 2

## 2013-05-27 MED ORDER — OXYTOCIN 40 UNITS IN LACTATED RINGERS INFUSION - SIMPLE MED
62.5000 mL/h | INTRAVENOUS | Status: DC
  Administered 2013-05-28: 62.5 mL/h via INTRAVENOUS

## 2013-05-27 MED ORDER — BETAMETHASONE SOD PHOS & ACET 6 (3-3) MG/ML IJ SUSP: 12.0000 mg | INTRAMUSCULAR | Status: DC

## 2013-05-27 MED ORDER — LABETALOL HCL 300 MG PO TABS
300.0000 mg | ORAL_TABLET | Freq: Three times a day (TID) | ORAL | Status: DC
  Administered 2013-05-27: 300 mg via ORAL
  Filled 2013-05-27: qty 1
  Filled 2013-05-27: qty 3
  Filled 2013-05-27: qty 1

## 2013-05-27 MED ORDER — HYDRALAZINE HCL 20 MG/ML IJ SOLN
5.0000 mg | INTRAMUSCULAR | Status: DC | PRN
  Administered 2013-05-27: 5 mg via INTRAVENOUS
  Filled 2013-05-27: qty 1

## 2013-05-27 MED ORDER — MISOPROSTOL 50MCG HALF TABLET
50.0000 ug | ORAL_TABLET | Freq: Once | ORAL | Status: AC
  Administered 2013-05-27: 50 ug via ORAL
  Filled 2013-05-27: qty 1

## 2013-05-27 MED ORDER — OXYCODONE-ACETAMINOPHEN 5-325 MG PO TABS: 1.0000 | ORAL_TABLET | ORAL | Status: DC | PRN

## 2013-05-27 MED ORDER — MAGNESIUM SULFATE BOLUS VIA INFUSION
4.0000 g | Freq: Once | INTRAVENOUS | Status: AC
  Administered 2013-05-27: 4 g via INTRAVENOUS
  Filled 2013-05-27: qty 500

## 2013-05-27 MED ORDER — SODIUM CHLORIDE 0.9 % IV SOLN: INTRAVENOUS | Status: DC

## 2013-05-27 MED ORDER — TERBUTALINE SULFATE 1 MG/ML IJ SOLN: 0.2500 mg | Freq: Once | INTRAMUSCULAR | Status: AC | PRN

## 2013-05-27 MED ORDER — ZOLPIDEM TARTRATE 5 MG PO TABS: 5.0000 mg | ORAL_TABLET | Freq: Every evening | ORAL | Status: DC | PRN

## 2013-05-27 MED ORDER — BETAMETHASONE SOD PHOS & ACET 6 (3-3) MG/ML IJ SUSP
12.0000 mg | INTRAMUSCULAR | Status: DC
  Filled 2013-05-27: qty 2

## 2013-05-27 MED ORDER — LABETALOL HCL 5 MG/ML IV SOLN
10.0000 mg | INTRAVENOUS | Status: AC
  Administered 2013-05-27: 10 mg via INTRAVENOUS
  Filled 2013-05-27: qty 4

## 2013-05-27 MED ORDER — BUTORPHANOL TARTRATE 1 MG/ML IJ SOLN
1.0000 mg | INTRAMUSCULAR | Status: DC | PRN
  Administered 2013-05-28: 1 mg via INTRAVENOUS
  Filled 2013-05-27: qty 1

## 2013-05-27 MED ORDER — MISOPROSTOL 25 MCG QUARTER TABLET
25.0000 ug | ORAL_TABLET | ORAL | Status: DC
  Filled 2013-05-27: qty 0.25

## 2013-05-27 MED ORDER — HYDRALAZINE HCL 20 MG/ML IJ SOLN: 5.0000 mg | INTRAMUSCULAR | Status: DC | PRN

## 2013-05-27 MED ORDER — LABETALOL HCL 5 MG/ML IV SOLN
40.0000 mg | Freq: Once | INTRAVENOUS | Status: AC
  Administered 2013-05-27: 40 mg via INTRAVENOUS
  Filled 2013-05-27: qty 8

## 2013-05-27 MED ORDER — LABETALOL HCL 100 MG PO TABS
300.0000 mg | ORAL_TABLET | Freq: Once | ORAL | Status: AC
  Administered 2013-05-27: 300 mg via ORAL
  Filled 2013-05-27: qty 3

## 2013-05-27 MED ORDER — HYDRALAZINE HCL 20 MG/ML IJ SOLN: 20.0000 mg | INTRAMUSCULAR | Status: DC | PRN

## 2013-05-27 MED ORDER — CITRIC ACID-SODIUM CITRATE 334-500 MG/5ML PO SOLN
30.0000 mL | ORAL | Status: DC | PRN
  Filled 2013-05-27: qty 15

## 2013-05-27 MED ORDER — IBUPROFEN 600 MG PO TABS
600.0000 mg | ORAL_TABLET | Freq: Four times a day (QID) | ORAL | Status: DC | PRN
  Administered 2013-05-28: 600 mg via ORAL
  Filled 2013-05-27: qty 1

## 2013-05-27 MED ORDER — BETAMETHASONE SOD PHOS & ACET 6 (3-3) MG/ML IJ SUSP
12.0000 mg | Freq: Once | INTRAMUSCULAR | Status: AC
  Administered 2013-05-28: 12 mg via INTRAMUSCULAR
  Filled 2013-05-27: qty 2

## 2013-05-27 MED ORDER — FENTANYL CITRATE 0.05 MG/ML IJ SOLN: 100.0000 ug | INTRAMUSCULAR | Status: DC | PRN

## 2013-05-27 MED ORDER — OXYTOCIN BOLUS FROM INFUSION: 500.0000 mL | INTRAVENOUS | Status: DC

## 2013-05-27 MED ORDER — LIDOCAINE HCL (PF) 1 % IJ SOLN: 30.0000 mL | INTRAMUSCULAR | Status: DC | PRN

## 2013-05-27 MED ORDER — ACETAMINOPHEN 325 MG PO TABS: 650.0000 mg | ORAL_TABLET | ORAL | Status: DC | PRN

## 2013-05-27 MED ORDER — LACTATED RINGERS IV SOLN
INTRAVENOUS | Status: DC
  Administered 2013-05-27 (×2): via INTRAVENOUS

## 2013-05-27 MED ORDER — DOCUSATE SODIUM 100 MG PO CAPS: 100.0000 mg | ORAL_CAPSULE | Freq: Every day | ORAL | Status: DC

## 2013-05-27 MED ORDER — LACTATED RINGERS IV SOLN
500.0000 mL | INTRAVENOUS | Status: DC | PRN
Start: 2013-05-27 — End: 2013-05-28
  Administered 2013-05-28: 500 mL via INTRAVENOUS

## 2013-05-27 NOTE — Progress Notes (Signed)
Robin AranRakesha Sherrine Erickson is a 28 y.o. G3P2002 at 1317w6d by ultrasound admitted for induction of labor due to chronic hypertension with superimposed preeclampsia poorly controlled despite IV meds.  S/p BMZ x1.  Pt remote from delivery so will begin her ripening now.  Subjective:  Pt with no complaints. She denies HA or visual complaints currently.  Objective: BP 157/110  Pulse 99  Temp(Src) 98.2 F (36.8 C) (Oral)  Resp 16  Ht 5' 2.5" (1.588 m)  Wt 167 lb (75.751 kg)  BMI 30.04 kg/m2  SpO2 100%  LMP 10/02/2012 I/O last 3 completed shifts: In: 466.7 [P.O.:50; I.V.:416.7] Out: 250 [Urine:250]    FHT:  FHR: 140's bpm, variability: moderate,  accelerations:  Present,  decelerations:  Absent UC:   irregular, every 2-5 minutes- no felt by patient at all SVE:   Dilation: Closed Effacement (%): Thick Station:  (High) Exam by:: dr Erin Fullingharraway-smith  Labs: Lab Results  Component Value Date   WBC 14.1* 05/27/2013   HGB 10.8* 05/27/2013   HCT 31.5* 05/27/2013   MCV 86.8 05/27/2013   PLT 348 05/27/2013    Assessment / Plan: Chronic hypertension with superimposed preeclampisa not well controlled with IV BP meds Labor: induction starting- s/p cytotec 50mcg Preeclampsia:  on magnesium sulfate Fetal Wellbeing:  Category I Pain Control:  no pain at present I/D:  n/a Anticipated MOD:  NSVD and discussed with pt that if she did not progress would need to proceed to c/s if her BP's could not be managed  HARRAWAY-SMITH, Manas Hickling 05/27/2013, 7:08 PM

## 2013-05-27 NOTE — Progress Notes (Signed)
Toma AranRakesha Sherrine MaplesGlenn is a 28 y.o. G3P2002 at 143w6d by admitted for induction of labor due to chronic HTN superimposed with severe pre-eclampsia.  Subjective: Pt states that contractions are tolerable.  She is up to the bathroom.  Objective: BP 145/88  Pulse 99  Temp(Src) 98.9 F (37.2 C) (Oral)  Resp 18  Ht 5' 2.5" (1.588 m)  Wt 75.751 kg (167 lb)  BMI 30.04 kg/m2  SpO2 100%  LMP 10/02/2012 I/O last 3 completed shifts: In: 466.7 [P.O.:50; I.V.:416.7] Out: 250 [Urine:250] Total I/O In: 360 [P.O.:360] Out: 675 [Urine:675]  FHT:  FHR: 145 bpm, variability: moderate,  accelerations:  Present,  decelerations:  Absent UC:   regular, every 2-4 minutes SVE:   1/40/-3, soft, mid   Assessment / Plan: IOL due to chronic HTN with superimposed preeclampsia  Labor: Progressing well, continue to administer cytotec per orders, Cytotec administered by TB, SNM due to regularity of cx Preeclampsia:  on magnesium sulfate and no signs or symptoms of toxicity Fetal Wellbeing:  Category I Pain Control:  Labor support without medications I/D:  n/a Anticipated MOD:  NSVD  Selena LesserBraimah, Wilson Dusenbery 05/27/2013, 11:23 PM

## 2013-05-27 NOTE — MAU Provider Note (Signed)
History     CSN: [id number]  Arrival date and time: 05/27/13 1029   First Provider Initiated Contact with Patient 05/27/13 1120      Chief Complaint  Patient presents with  . Hypertension   HPI  Pt is 28 yo G3P2 33week 6day currently pregnant female with a PMH of hypertension, migraines, and preeclampsia presenting with recent headaches, peripheral edema, and dizziness, and blurred vision. Pt states that starting one month ago she began to experience swelling in her hands and feet. Over the course of the past week she has had two episodes of blurred vision, one while standing, one while sitting, w/o any visual spots. Pt also endorses an increase in the frequency of her migraine headaches over the past week. Pt has had one episode of dizziness while sitting down, which she qualified as "room spinning" in nature. She has had no new abdominal pain over this time. No chest pain or shortness of breath. Pt has had no past history of similar symptomology, however experienced preeclampsia with her last pregnancy, which required induction of labor. Pt who is currently taking labetalol 300mg  3x daily, states that she has missed 2 doses of the medication this week however has been adherent with her medication today.  She denies any recent increase in stress or recent illness  Past Medical History  Diagnosis Date  . Hypertension   . Abnormal Pap smear     Past Surgical History  Procedure Laterality Date  . Wisdom tooth extraction    . Eye surgery      age 89    Family History  Problem Relation Age of Onset  . Hypertension Father   . Diabetes Maternal Aunt   . Hypertension Mother   . Glaucoma Maternal Grandmother     History  Substance Use Topics  . Smoking status: Never Smoker   . Smokeless tobacco: Never Used  . Alcohol Use: No    Allergies: No Known Allergies  Prescriptions prior to admission  Medication Sig Dispense Refill  . calcium carbonate (TUMS - DOSED IN MG ELEMENTAL  CALCIUM) 500 MG chewable tablet Chew 2 tablets by mouth 3 (three) times daily as needed for indigestion or heartburn.      . labetalol (NORMODYNE) 200 MG tablet Take 1.5 tablets (300 mg total) by mouth 3 (three) times daily.  135 tablet  5  . promethazine (PHENERGAN) 25 MG tablet Take 1 tablet (25 mg total) by mouth every 6 (six) hours as needed for nausea.  30 tablet  0  . SUMAtriptan (IMITREX) 100 MG tablet Take 1 tablet (100 mg total) by mouth once as needed for migraine. May repeat in 2 hours if headache persists or recurs.  10 tablet  2    Review of Systems  Constitutional: Negative for fever and chills.  HENT: Negative for congestion and sore throat.   Eyes: Positive for blurred vision.  Respiratory: Negative for cough and shortness of breath.   Cardiovascular: Negative for chest pain.  Gastrointestinal: Negative for nausea, vomiting and abdominal pain.  Genitourinary: Negative for dysuria.  Neurological: Positive for dizziness and headaches. Negative for tingling and seizures.   Physical Exam   Blood pressure 150/111, pulse 88, temperature 98 F (36.7 C), temperature source Oral, resp. rate 16, height 5' 2.5" (1.588 m), weight 75.841 kg (167 lb 3.2 oz), last menstrual period 10/02/2012, SpO2 100.00%.  Physical Exam  Constitutional: No distress.  Cardiovascular: Normal rate and regular rhythm.   Respiratory: Effort normal and breath sounds  normal.  GI: There is no tenderness.  Neurological: She displays normal reflexes.    MAU Course  Procedures  MDM Collecting labs, monitoring, and will give one PO dose of labetalol  Assessment and Plan  See H&P for plan   Arthur Holms 05/27/2013, 11:28 AM

## 2013-05-27 NOTE — Consult Note (Signed)
Neonatology Consult to Antenatal Patient:  I was asked by Dr. Erin FullingHarraway-Smith to see this patient in order to provide antenatal counseling due to planned induction at 33 6/7 weeks due to chronic HTN with superimposed pre-eclampsia .  Ms. Robin Erickson is currently not having active labor. She is getting BMZ (second dose scheduled for 3/7), Labetalol, Magnesium Sulfate, and Hydralazine.  I spoke with the patient and the father of the baby. We discussed what to expect at delivery in the next 1-2 days, including usual DR management, possible respiratory complications and need for support, IV access, feedings (mother desires breast feeding, which was encouraged), LOS, Mortality and Morbidity, and long term outcomes. They did not have any questions at this time. I offered a NICU tour to any interested family members and would be glad to come back if they have more questions later.  Thank you for asking me to see this patient.  Doretha Souhristie C. Ohm Dentler, MD Neonatologist  The total length of face-to-face or floor/unit time for this encounter was 20 minutes. Counseling and/or coordination of care was 15 minutes of the above.

## 2013-05-27 NOTE — Progress Notes (Signed)
Doing well.  Good fetal movement, denies vaginal bleeding, LOF, regular contractions. Does report new onset headaches and blurred vision this week, none today.  Protein 2+ today.  Had U/S with MFM on Monday, needs NST this week. Sent to MAU today for preeclampsia evaluation and NST.

## 2013-05-27 NOTE — MAU Note (Signed)
Patient states she was sent from the office for evaluation of elevated blood pressure. States she has had a headache and blurred vision twice this week but having neither at this time. Denies contractions, leaking or bleeding. Reports good fetal movement.

## 2013-05-27 NOTE — H&P (Signed)
History    CSN: [id number]  Arrival date and time: 05/27/13 1029  First Provider Initiated Contact with Patient 05/27/13 1120  Chief Complaint   Patient presents with   .  Hypertension    HPI  Pt is 28 yo G3P2 33week 6day by L=6+5 currently pregnant female with a PMH of Chronic hypertension, migraines and PReX with prior pregnancy presenting with recent headaches, peripheral edema, and dizziness, and blurred vision.   Pt states that starting one month ago she began to experience swelling in her hands and feet. Over the course of the past week she has had two episodes of blurred vision, one while standing, one while sitting, w/o any visual spots. Pt also endorses an increase in the frequency of her migraine headaches over the past week. Pt has had one episode of dizziness while sitting down, which she qualified as "room spinning" in nature. She has had no new abdominal pain over this time. No chest pain or shortness of breath. Pt has had no past history of similar symptomology, however experienced preeclampsia with her last pregnancy, which required induction of labor. Pt who is currently taking labetalol 345m 3x daily, states that she has missed 2 doses of the medication this week however has been adherent with her medication today. She denies any recent increase in stress or recent illness   +FM, no LOF, no VB, no Ctx Past Medical History   Diagnosis  Date   .  Hypertension    .  Abnormal Pap smear     Past Surgical History   Procedure  Laterality  Date   .  Wisdom tooth extraction     .  Eye surgery       age 28   Family History   Problem  Relation  Age of Onset   .  Hypertension  Father    .  Diabetes  Maternal Aunt    .  Hypertension  Mother    .  Glaucoma  Maternal Grandmother     History   Substance Use Topics   .  Smoking status:  Never Smoker   .  Smokeless tobacco:  Never Used   .  Alcohol Use:  No    Allergies: No Known Allergies  Prescriptions prior to admission    Medication  Sig  Dispense  Refill   .  calcium carbonate (TUMS - DOSED IN MG ELEMENTAL CALCIUM) 500 MG chewable tablet  Chew 2 tablets by mouth 3 (three) times daily as needed for indigestion or heartburn.     .  labetalol (NORMODYNE) 200 MG tablet  Take 1.5 tablets (300 mg total) by mouth 3 (three) times daily.  135 tablet  5   .  promethazine (PHENERGAN) 25 MG tablet  Take 1 tablet (25 mg total) by mouth every 6 (six) hours as needed for nausea.  30 tablet  0   .  SUMAtriptan (IMITREX) 100 MG tablet  Take 1 tablet (100 mg total) by mouth once as needed for migraine. May repeat in 2 hours if headache persists or recurs.  10 tablet  2    Review of Systems  Constitutional: Negative for fever and chills.  HENT: Negative for congestion and sore throat.  Eyes: Positive for blurred vision.  Respiratory: Negative for cough and shortness of breath.  Cardiovascular: Negative for chest pain.  Gastrointestinal: Negative for nausea, vomiting and abdominal pain.  Genitourinary: Negative for dysuria.  Neurological: Positive for dizziness and headaches. Negative for tingling and  seizures.   Physical Exam   Blood pressure 150/111, pulse 88, temperature 98 F (36.7 C), temperature source Oral, resp. rate 16, height 5' 2.5" (1.588 m), weight 75.841 kg (167 lb 3.2 oz), last menstrual period 10/02/2012, SpO2 100.00%.  Physical Exam  Constitutional: No distress.  Cardiovascular: Normal rate and regular rhythm.  Respiratory: Effort normal and breath sounds normal.  GI: There is no tenderness.  Neurological: She displays normal reflexes.   Dilation: 1 Effacement (%): Thick Station:  (High) Exam by:: Dr. Cranford Mon  FHT: 140s min variability, no accel,   Results for orders placed during the hospital encounter of 05/27/13 (from the past 72 hour(s))  PROTEIN / CREATININE RATIO, URINE     Status: Abnormal   Collection Time    05/27/13 10:55 AM      Result Value Ref Range   Creatinine, Urine 236.37     Total  Protein, Urine 147.2     Comment: NO NORMAL RANGE ESTABLISHED FOR THIS TEST   PROTEIN CREATININE RATIO 0.62 (*) 0.00 - 0.15   Comment: Performed at [facility]  CBC     Status: Abnormal   Collection Time    05/27/13 11:12 AM      Result Value Ref Range   WBC 14.1 (*) 4.0 - 10.5 K/uL   RBC 3.63 (*) 3.87 - 5.11 MIL/uL   Hemoglobin 10.8 (*) 12.0 - 15.0 g/dL   HCT 31.5 (*) 36.0 - 46.0 %   MCV 86.8  78.0 - 100.0 fL   MCH 29.8  26.0 - 34.0 pg   MCHC 34.3  30.0 - 36.0 g/dL   RDW 13.9  11.5 - 15.5 %   Platelets 348  150 - 400 K/uL  COMPREHENSIVE METABOLIC PANEL     Status: Abnormal   Collection Time    05/27/13 11:12 AM      Result Value Ref Range   Sodium 138  137 - 147 mEq/L   Potassium 4.2  3.7 - 5.3 mEq/L   Chloride 103  96 - 112 mEq/L   CO2 19  19 - 32 mEq/L   Glucose, Bld 83  70 - 99 mg/dL   BUN 12  6 - 23 mg/dL   Creatinine, Ser 0.59  0.50 - 1.10 mg/dL   Calcium 10.9 (*) 8.4 - 10.5 mg/dL   Total Protein 7.5  6.0 - 8.3 g/dL   Albumin 2.7 (*) 3.5 - 5.2 g/dL   AST 17  0 - 37 U/L   ALT 7  0 - 35 U/L   Alkaline Phosphatase 103  39 - 117 U/L   Total Bilirubin <0.2 (*) 0.3 - 1.2 mg/dL   Comment: REPEATED TO VERIFY   GFR calc non Af Amer >90  >90 mL/min   GFR calc Af Amer >90  >90 mL/min   Comment: (NOTE)     The eGFR has been calculated using the CKD EPI equation.     This calculation has not been validated in all clinical situations.     eGFR's persistently <90 mL/min signify possible Chronic Kidney     Disease.   Office [facility]  Genetic Screen  first trimester screen negative.  Needs AFP after 15 weeks > neg  Anatomic [place]  Nml @ 18 wks; poor view of heart and spine > rescan at 22-24 wks > nml  Glucose Screen  94  GC / Chlamydia  N/N  GBS     Feeding Preference  Breast  Contraception   Pap negative;  GC/CT neg x 2  Peds   Circumcision      Prenatal Transfer Tool  Maternal Diabetes: No Genetic Screening: Normal Maternal Ultrasounds/Referrals: Normal Fetal  Ultrasounds or other Referrals:  None Maternal Substance Abuse:  No Significant Maternal Medications:  labetalol Significant Maternal Lab Results: Lab values include: Other: GBS Unknown pending     8/8 BPP  MAU Course   Procedures  MDM  Collecting labs, monitoring, and will give one PO dose of labetalol  Assessment and Plan     Jaylei Fuerte is a 28 y.o. G3P2002 at 21w6dby L=6 here for superimposed severe preeclampsia based on pressures and PR:CR #Labor: Observe until 34 and steroids on board. Steroids given now. Starting magnesium as well. If continues to have severe BPs that are not able to be controlled with IV meds will move to delivery. Currently not favorable but will begin induction tomorrow. #Pain: Desires IV pain meds and epidural as needed #FWB: Cat II, BPP 8/8, BMZ x2 first dose today. Begin induction after 2nd dose if able.  #ID:  GBS pending. Sent for PCR #MOF: Breast  MFredrik Rigger MD OB Fellow

## 2013-05-28 ENCOUNTER — Encounter (HOSPITAL_COMMUNITY): Payer: Medicaid Other | Admitting: Anesthesiology

## 2013-05-28 ENCOUNTER — Encounter (HOSPITAL_COMMUNITY): Payer: Self-pay | Admitting: *Deleted

## 2013-05-28 ENCOUNTER — Inpatient Hospital Stay (HOSPITAL_COMMUNITY): Payer: Medicaid Other | Admitting: Anesthesiology

## 2013-05-28 DIAGNOSIS — O9903 Anemia complicating the puerperium: Secondary | ICD-10-CM

## 2013-05-28 DIAGNOSIS — I10 Essential (primary) hypertension: Secondary | ICD-10-CM

## 2013-05-28 DIAGNOSIS — IMO0002 Reserved for concepts with insufficient information to code with codable children: Secondary | ICD-10-CM

## 2013-05-28 DIAGNOSIS — D649 Anemia, unspecified: Secondary | ICD-10-CM

## 2013-05-28 LAB — CBC
HCT: 29.5 % — ABNORMAL LOW (ref 36.0–46.0)
HCT: 30 % — ABNORMAL LOW (ref 36.0–46.0)
Hemoglobin: 10 g/dL — ABNORMAL LOW (ref 12.0–15.0)
Hemoglobin: 10 g/dL — ABNORMAL LOW (ref 12.0–15.0)
MCH: 29.4 pg (ref 26.0–34.0)
MCH: 29.2 pg (ref 26.0–34.0)
MCHC: 33.9 g/dL (ref 30.0–36.0)
MCHC: 33.3 g/dL (ref 30.0–36.0)
MCV: 86.8 fL (ref 78.0–100.0)
MCV: 87.5 fL (ref 78.0–100.0)
PLATELETS: 356 10*3/uL (ref 150–400)
Platelets: 343 10*3/uL (ref 150–400)
RBC: 3.4 MIL/uL — ABNORMAL LOW (ref 3.87–5.11)
RBC: 3.43 MIL/uL — AB (ref 3.87–5.11)
RDW: 14.2 % (ref 11.5–15.5)
RDW: 14.1 % (ref 11.5–15.5)
WBC: 18.5 10*3/uL — ABNORMAL HIGH (ref 4.0–10.5)
WBC: 17 10*3/uL — ABNORMAL HIGH (ref 4.0–10.5)

## 2013-05-28 LAB — MAGNESIUM: MAGNESIUM: 4.5 mg/dL — AB (ref 1.5–2.5)

## 2013-05-28 LAB — RPR: RPR: NONREACTIVE

## 2013-05-28 MED ORDER — LIDOCAINE HCL (PF) 1 % IJ SOLN
INTRAMUSCULAR | Status: DC | PRN
  Administered 2013-05-28 (×2): 4 mL

## 2013-05-28 MED ORDER — CALCIUM CARBONATE ANTACID 500 MG PO CHEW
400.0000 mg | CHEWABLE_TABLET | Freq: Every day | ORAL | Status: DC | PRN
  Administered 2013-05-28: 400 mg via ORAL
  Filled 2013-05-28: qty 1

## 2013-05-28 MED ORDER — PRENATAL MULTIVITAMIN CH
1.0000 | ORAL_TABLET | Freq: Every day | ORAL | Status: DC
  Administered 2013-05-29 – 2013-05-30 (×2): 1 via ORAL
  Filled 2013-05-28 (×2): qty 1

## 2013-05-28 MED ORDER — TETANUS-DIPHTH-ACELL PERTUSSIS 5-2.5-18.5 LF-MCG/0.5 IM SUSP
0.5000 mL | Freq: Once | INTRAMUSCULAR | Status: DC
  Filled 2013-05-28: qty 0.5

## 2013-05-28 MED ORDER — IBUPROFEN 600 MG PO TABS
600.0000 mg | ORAL_TABLET | Freq: Four times a day (QID) | ORAL | Status: DC
  Administered 2013-05-29 – 2013-05-30 (×6): 600 mg via ORAL
  Filled 2013-05-28 (×6): qty 1

## 2013-05-28 MED ORDER — FENTANYL CITRATE 0.05 MG/ML IJ SOLN
100.0000 ug | INTRAMUSCULAR | Status: DC | PRN
  Administered 2013-05-28 (×2): 100 ug via INTRAVENOUS
  Filled 2013-05-28 (×2): qty 2

## 2013-05-28 MED ORDER — BENZOCAINE-MENTHOL 20-0.5 % EX AERO
1.0000 "application " | INHALATION_SPRAY | CUTANEOUS | Status: DC | PRN
  Filled 2013-05-28: qty 56

## 2013-05-28 MED ORDER — TERBUTALINE SULFATE 1 MG/ML IJ SOLN: 0.2500 mg | Freq: Once | INTRAMUSCULAR | Status: DC | PRN

## 2013-05-28 MED ORDER — LACTATED RINGERS IV SOLN
500.0000 mL | Freq: Once | INTRAVENOUS | Status: AC
  Administered 2013-05-28: 14:00:00 via INTRAVENOUS

## 2013-05-28 MED ORDER — SENNOSIDES-DOCUSATE SODIUM 8.6-50 MG PO TABS
2.0000 | ORAL_TABLET | ORAL | Status: DC
  Administered 2013-05-28 – 2013-05-29 (×2): 2 via ORAL
  Filled 2013-05-28 (×2): qty 2

## 2013-05-28 MED ORDER — FENTANYL 2.5 MCG/ML BUPIVACAINE 1/10 % EPIDURAL INFUSION (WH - ANES): 14.0000 mL/h | INTRAMUSCULAR | Status: DC | PRN

## 2013-05-28 MED ORDER — SIMETHICONE 80 MG PO CHEW: 80.0000 mg | CHEWABLE_TABLET | ORAL | Status: DC | PRN

## 2013-05-28 MED ORDER — OXYTOCIN 40 UNITS IN LACTATED RINGERS INFUSION - SIMPLE MED
1.0000 m[IU]/min | INTRAVENOUS | Status: DC
  Administered 2013-05-28: 2 m[IU]/min via INTRAVENOUS
  Filled 2013-05-28: qty 1000

## 2013-05-28 MED ORDER — EPHEDRINE 5 MG/ML INJ
10.0000 mg | INTRAVENOUS | Status: DC | PRN
  Filled 2013-05-28: qty 4

## 2013-05-28 MED ORDER — PHENYLEPHRINE 40 MCG/ML (10ML) SYRINGE FOR IV PUSH (FOR BLOOD PRESSURE SUPPORT)
80.0000 ug | PREFILLED_SYRINGE | INTRAVENOUS | Status: DC | PRN
  Filled 2013-05-28: qty 10

## 2013-05-28 MED ORDER — DIPHENHYDRAMINE HCL 25 MG PO CAPS: 25.0000 mg | ORAL_CAPSULE | Freq: Four times a day (QID) | ORAL | Status: DC | PRN

## 2013-05-28 MED ORDER — MISOPROSTOL 50MCG HALF TABLET
50.0000 ug | ORAL_TABLET | Freq: Once | ORAL | Status: AC
  Administered 2013-05-28: 50 ug via ORAL
  Filled 2013-05-28: qty 1

## 2013-05-28 MED ORDER — ZOLPIDEM TARTRATE 5 MG PO TABS: 5.0000 mg | ORAL_TABLET | Freq: Every evening | ORAL | Status: DC | PRN

## 2013-05-28 MED ORDER — EPHEDRINE 5 MG/ML INJ: 10.0000 mg | INTRAVENOUS | Status: DC | PRN

## 2013-05-28 MED ORDER — DIBUCAINE 1 % RE OINT
1.0000 "application " | TOPICAL_OINTMENT | RECTAL | Status: DC | PRN
  Filled 2013-05-28: qty 28

## 2013-05-28 MED ORDER — DIPHENHYDRAMINE HCL 50 MG/ML IJ SOLN: 12.5000 mg | INTRAMUSCULAR | Status: DC | PRN

## 2013-05-28 MED ORDER — FENTANYL 2.5 MCG/ML BUPIVACAINE 1/10 % EPIDURAL INFUSION (WH - ANES)
INTRAMUSCULAR | Status: DC | PRN
  Administered 2013-05-28: 14 mL/h via EPIDURAL

## 2013-05-28 MED ORDER — WITCH HAZEL-GLYCERIN EX PADS: 1.0000 "application " | MEDICATED_PAD | CUTANEOUS | Status: DC | PRN

## 2013-05-28 MED ORDER — FENTANYL 2.5 MCG/ML BUPIVACAINE 1/10 % EPIDURAL INFUSION (WH - ANES)
14.0000 mL/h | INTRAMUSCULAR | Status: DC | PRN
  Filled 2013-05-28: qty 125

## 2013-05-28 MED ORDER — PHENYLEPHRINE 40 MCG/ML (10ML) SYRINGE FOR IV PUSH (FOR BLOOD PRESSURE SUPPORT)
80.0000 ug | PREFILLED_SYRINGE | INTRAVENOUS | Status: DC | PRN
  Filled 2013-05-28: qty 2

## 2013-05-28 MED ORDER — LANOLIN HYDROUS EX OINT: TOPICAL_OINTMENT | CUTANEOUS | Status: DC | PRN

## 2013-05-28 NOTE — Progress Notes (Signed)
Robin Erickson is a 28 y.o. G3P2002 at 2296w6d by admitted for induction of labor due to chronic HTN superimposed with severe pre-eclampsia.  Subjective: Pt is sleeping quietly.  Objective: BP 118/102  Pulse 147  Temp(Src) 98.9 F (37.2 C) (Oral)  Resp 18  Ht 5' 2.5" (1.588 m)  Wt 75.751 kg (167 lb)  BMI 30.04 kg/m2  SpO2 100%  LMP 10/02/2012 I/O last 3 completed shifts: In: 466.7 [P.O.:50; I.V.:416.7] Out: 250 [Urine:250] Total I/O In: 360 [P.O.:360] Out: 1390 [Urine:1220; Emesis/NG output:170]  FHT:  FHR: 120 bpm, variability: minimal ,  accelerations:  Abscent,  decelerations:  Absent, Earlier occ late-onset variablesto the 90s- no pattern UC:   irregular Dilation: 1 Effacement (%): 40 Cervical Position: Middle Station:  (high) Presentation: Vertex Exam by:: Inetta Fermoina, CNM    Assessment / Plan: IOL due to chronic HTN with superimposed preeclampsia  Labor: Progressing well, continue to administer cytotec per orders Preeclampsia:  on magnesium sulfate and no signs or symptoms of toxicity Fetal Wellbeing:  Category II Pain Control:  Labor support without medications I/D:  n/a Anticipated MOD:  NSVD   Continue to monitor fetus for changes in HR. Minimal variability likely due to mom's recent medications.  Selena LesserBraimah, Tina 05/28/2013, 2:28 AM  I have seen and examined this patient and I agree with the above. Cam HaiSHAW, Flara Storti 9:49 AM 05/28/2013

## 2013-05-28 NOTE — Progress Notes (Signed)
Robin Erickson is a 28 y.o. G3P2002 at 5195w6d by admitted for induction of labor due to chronic HTN superimposed with severe pre-eclampsia.  Subjective: Pt is sleeping with FB in. Did not wake  Objective: BP 131/84  Pulse 105  Temp(Src) 98.3 F (36.8 C) (Oral)  Resp 20  Ht 5' 2.5" (1.588 m)  Wt 75.751 kg (167 lb)  BMI 30.04 kg/m2  SpO2 100%  LMP 10/02/2012 I/O last 3 completed shifts: In: 2348.3 [P.O.:590; I.V.:1758.3] Out: 2690 [Urine:2520; Emesis/NG output:170] Total I/O In: 720 [P.O.:120; I.V.:600] Out: 0  Filed Vitals:   05/28/13 1130 05/28/13 1200 05/28/13 1230 05/28/13 1301  BP:  143/85  131/84  Pulse:  92  105  Temp:      TempSrc:      Resp: 18 20 18 20   Height:      Weight:      SpO2:         FHT:  FHR: 140s bpm, variability: moderate,  accelerations:  Abscent,  decelerations:  Absent, UC:   Irregular ~q3-8 but unable to trace Dilation: 2 Effacement (%): 30 Cervical Position: Middle Station: -3 Presentation: Vertex Exam by:: dr Kayden Hutmacher    Assessment / Plan: IOL due to chronic HTN with superimposed preeclampsia, rec'd several doses of cytotec, now with FB in place  Labor: Progressing well, FB in place, if not out at 1400, will start pit, 2x2 to continue induction. Preeclampsia:  on magnesium sulfate and no signs or symptoms of toxicity, some severe range over night but improved this AM Fetal Wellbeing:  Cat I Pain Control:  Fent PRN, considering Epidural I/D:  n/a Anticipated MOD:  NSVD  Robin Erickson RYAN 1:23 PM 05/28/2013

## 2013-05-28 NOTE — Anesthesia Preprocedure Evaluation (Addendum)
Anesthesia Evaluation  Patient identified by MRN, date of birth, ID band Patient awake    Reviewed: Allergy & Precautions, H&P , NPO status , Patient's Chart, lab work & pertinent test results, reviewed documented beta blocker date and time   Airway Mallampati: II TM Distance: >3 FB     Dental  (+) Dental Advisory Given   Pulmonary neg pulmonary ROS,          Cardiovascular hypertension, Pt. on medications and Pt. on home beta blockers negative cardio ROS  Rhythm:Regular     Neuro/Psych  Headaches, negative psych ROS   GI/Hepatic negative GI ROS, Neg liver ROS,   Endo/Other  negative endocrine ROS  Renal/GU negative Renal ROS     Musculoskeletal negative musculoskeletal ROS (+)   Abdominal   Peds  Hematology negative hematology ROS (+)   Anesthesia Other Findings   Reproductive/Obstetrics (+) Pregnancy                           Anesthesia Physical Anesthesia Plan  ASA: III  Anesthesia Plan: Epidural   Post-op Pain Management:    Induction:   Airway Management Planned:   Additional Equipment:   Intra-op Plan:   Post-operative Plan:   Informed Consent: I have reviewed the patients History and Physical, chart, labs and discussed the procedure including the risks, benefits and alternatives for the proposed anesthesia with the patient or authorized representative who has indicated his/her understanding and acceptance.     Plan Discussed with:   Anesthesia Plan Comments:         Anesthesia Quick Evaluation

## 2013-05-28 NOTE — Progress Notes (Signed)
Foley bulb fell out while pt voiding in BR.

## 2013-05-28 NOTE — Anesthesia Procedure Notes (Signed)
Epidural Patient location during procedure: OB Start time: 05/28/2013 2:17 PM End time: 05/28/2013 2:27 PM  Staffing Anesthesiologist: Lewie LoronGERMEROTH, Gissela Bloch R Performed by: anesthesiologist   Preanesthetic Checklist Completed: patient identified, pre-op evaluation, timeout performed, IV checked, risks and benefits discussed and monitors and equipment checked  Epidural Patient position: sitting Prep: site prepped and draped and DuraPrep Patient monitoring: heart rate Approach: midline Location: L2-L3 Injection technique: LOR air and LOR saline  Needle:  Needle type: Tuohy  Needle gauge: 17 G Needle length: 9 cm Needle insertion depth: 5 cm Catheter type: closed end flexible Catheter size: 19 Gauge Catheter at skin depth: 11 cm Test dose: negative  Assessment Sensory level: T8 Events: blood not aspirated, injection not painful, no injection resistance, negative IV test and no paresthesia  Additional Notes Reason for block:procedure for pain

## 2013-05-28 NOTE — Progress Notes (Signed)
Toma AranRakesha Sherrine MaplesGlenn is a 28 y.o. G3P2002 at 5479w6d by admitted for induction of labor due to chronic HTN superimposed with severe pre-eclampsia.  Subjective: FB out, pt s/p epidural. On pit, Pt comfortable  Objective: BP 137/96  Pulse 101  Temp(Src) 98.2 F (36.8 C) (Oral)  Resp 20  Ht 5' 2.5" (1.588 m)  Wt 75.751 kg (167 lb)  BMI 30.04 kg/m2  SpO2 99%  LMP 10/02/2012 I/O last 3 completed shifts: In: 2348.3 [P.O.:590; I.V.:1758.3] Out: 2690 [Urine:2520; Emesis/NG output:170] Total I/O In: 1371.2 [P.O.:360; I.V.:1011.2] Out: 860 [Urine:560; Emesis/NG output:300] Filed Vitals:   05/28/13 1601 05/28/13 1631 05/28/13 1701 05/28/13 1731  BP: 135/95 139/99 142/99 137/96  Pulse: 102 101 100 101  Temp: 97.4 F (36.3 C)  98.2 F (36.8 C)   TempSrc: Axillary  Oral   Resp: 18 20 18 20   Height:      Weight:      SpO2:         FHT:  FHR: 130s bpm, variability: moderate,  accelerations:  Abscent,  decelerations:  Absent UC:  regular ~q 3-4    Dilation: 7 Effacement (%): 50 Cervical Position: Posterior Station: -3 Presentation: Vertex Exam by:: dr Kafi Dotter    Assessment / Plan: IOL due to chronic HTN with superimposed preeclampsia, rec'd several doses of cytotec, now s/p FB, on pit and s/p AROM @ 1730  Labor: Progressing well, FB out, now s/p AROM. Continue to inc Pit Preeclampsia:  on magnesium sulfate and no signs or symptoms of toxicity, some severe range over night but improved this today Fetal Wellbeing:  Cat I Pain Control:  Epidural I/D:  GBS neg Anticipated MOD:  NSVD  Jimmy Plessinger RYAN 5:37 PM 05/28/2013

## 2013-05-28 NOTE — Progress Notes (Signed)
Robin AranRakesha Sherrine Erickson is a 28 y.o. G3P2002 at 3858w6d by admitted for induction of labor due to chronic HTN superimposed with severe pre-eclampsia.  Subjective: Pt is sleeping quietly.  Objective: BP 143/94  Pulse 98  Temp(Src) 98.3 F (36.8 C) (Oral)  Resp 20  Ht 5' 2.5" (1.588 m)  Wt 75.751 kg (167 lb)  BMI 30.04 kg/m2  SpO2 100%  LMP 10/02/2012 I/O last 3 completed shifts: In: 2348.3 [P.O.:590; I.V.:1758.3] Out: 2690 [Urine:2520; Emesis/NG output:170] Total I/O In: 300 [I.V.:300] Out: 0  Filed Vitals:   05/28/13 0800 05/28/13 0810 05/28/13 0900 05/28/13 1001  BP: 136/84  139/103 143/94  Pulse: 107  104 98  Temp: 98.3 F (36.8 C)     TempSrc: Oral     Resp: 18 18 18 20   Height:      Weight:      SpO2:         FHT:  FHR: 120 bpm, variability: moderate,  accelerations:  Abscent,  decelerations:  Absent, UC:   Irregular ~q3 but unable to trace Dilation: 2 Effacement (%): 30 Cervical Position: Middle Station: -3 Presentation: Vertex Exam by:: dr Dayelin Balducci    Assessment / Plan: IOL due to chronic HTN with superimposed preeclampsia, rec'd several doses of cytotec, now with FB in place  Labor: Progressing well, FB placed Preeclampsia:  on magnesium sulfate and no signs or symptoms of toxicity, some severe range over night but improved this AM Fetal Wellbeing:  Cat I Pain Control:  Fent PRN, considering Epidural I/D:  n/a Anticipated MOD:  NSVD  Robin Erickson RYAN 10:14 AM 05/28/2013

## 2013-05-28 NOTE — Progress Notes (Signed)
I have seen and examined this patient and I agree with the above. SHAW, KIMBERLY 1:25 AM 05/28/2013

## 2013-05-28 NOTE — Progress Notes (Signed)
Robin Erickson is a 28 y.o. G3P2002 at 744w6d by admitted for induction of labor due to chronic HTN superimposed with severe pre-eclampsia.  Subjective: FB out, pt s/p epidural. Pt otherwise comfortable  Objective: BP 117/81  Pulse 104  Temp(Src) 98.3 F (36.8 C) (Oral)  Resp 18  Ht 5' 2.5" (1.588 m)  Wt 75.751 kg (167 lb)  BMI 30.04 kg/m2  SpO2 99%  LMP 10/02/2012 I/O last 3 completed shifts: In: 2348.3 [P.O.:590; I.V.:1758.3] Out: 2690 [Urine:2520; Emesis/NG output:170] Total I/O In: 940 [P.O.:240; I.V.:700] Out: 750 [Urine:450; Emesis/NG output:300] Filed Vitals:   05/28/13 1456 05/28/13 1501 05/28/13 1505 05/28/13 1506  BP: 106/53 111/65 124/87 117/81  Pulse: 87 100 107 104  Temp:      TempSrc:      Resp:   20 18  Height:      Weight:      SpO2:         FHT:  FHR: 130s bpm, variability: moderate,  accelerations:  Abscent,  decelerations:  Present 3 late decels, then a prolonged decel to 50s that resolved with repositioning. none for last 20 mins, UC:   Irregular ~q 8 but unable to trace Dilation: 6 Effacement (%): 50 Cervical Position: Posterior Station: -3 Presentation: Vertex Exam by:: [email or web address]    Assessment / Plan: IOL due to chronic HTN with superimposed preeclampsia, rec'd several doses of cytotec, now s/p FB  Labor: Progressing well, FB out, if strip stable will start pit Preeclampsia:  on magnesium sulfate and no signs or symptoms of toxicity, some severe range over night but improved this today Fetal Wellbeing:  Cat II Pain Control:  Epidural I/D:  GBS neg Anticipated MOD:  NSVD  Robin Erickson 3:16 PM 05/28/2013

## 2013-05-28 NOTE — Progress Notes (Signed)
Pt Vomiting at 0150

## 2013-05-29 LAB — CBC
HCT: 26.8 % — ABNORMAL LOW (ref 36.0–46.0)
Hemoglobin: 8.9 g/dL — ABNORMAL LOW (ref 12.0–15.0)
MCH: 28.9 pg (ref 26.0–34.0)
MCHC: 33.2 g/dL (ref 30.0–36.0)
MCV: 87 fL (ref 78.0–100.0)
PLATELETS: 349 10*3/uL (ref 150–400)
RBC: 3.08 MIL/uL — ABNORMAL LOW (ref 3.87–5.11)
RDW: 14 % (ref 11.5–15.5)
WBC: 19.9 10*3/uL — ABNORMAL HIGH (ref 4.0–10.5)

## 2013-05-29 LAB — MRSA PCR SCREENING: MRSA BY PCR: NEGATIVE

## 2013-05-29 MED ORDER — LACTATED RINGERS IV SOLN
INTRAVENOUS | Status: DC
  Administered 2013-05-29 (×2): via INTRAVENOUS

## 2013-05-29 NOTE — Progress Notes (Signed)
Clinical Social Work Department PSYCHOSOCIAL ASSESSMENT - MATERNAL/CHILD Jul 18, 2013  Patient:  Robin Erickson  Account Number:  [id number]  Admit Date:  May 12, 2013  Ardine Eng Name:   [place] Social Worker:  Asiel Chrostowski, LCSW   Date/Time:  2014/01/12 11:00 AM  Date Referred:  22-Aug-2013   Referral source  NICU     Referred reason  NICU   Other referral source:    I:  FAMILY / [place] legal guardian:  PARENT  Guardian - Name [place] - Age Guardian - Address  [employer] [address] [place], [place] [address]  Tempie Hoist     Other household support members/support persons Other support:    II  PSYCHOSOCIAL DATA Information Source:    Occupational hygienist Employment:   Museum/gallery curator resources:  [employer] / Grade:   Maternity Care Coordinator / Child Services Coordination / Early Interventions:  Cultural issues impacting care:    III  STRENGTHS Strengths  Supportive family/friends  Home prepared for Child (including basic supplies)  Adequate Resources   Strength comment:    IV  RISK FACTORS AND CURRENT PROBLEMS Current Problem:       V  SOCIAL WORK ASSESSMENT Met with both parents who were pleasant and receptive to social work intervention.  Parents are not married.  Mother have two other dependents ages 31 and 24.   FOB is employed and supportive.  Both parents seems to be coping well with newborn NICU admission.  Informed that they have spoken with the medical team and was told that newborn is doing well.  Mother reports on transportation issues to return to NICU after she is discharged.  She denies any hx of substance abuse or mental illness.  No acute social concerns related at this time.  Parents informed of social work availability      [place]  Assessment of Needs

## 2013-05-29 NOTE — Addendum Note (Signed)
Addendum created 05/29/13 1012 by Jhonnie GarnerBeth M Keili Hasten, CRNA   Modules edited: Charges VN, Notes Section   Notes Section:  File: [id number]

## 2013-05-29 NOTE — Progress Notes (Addendum)
Post Partum Day 1 Subjective: no complaints, voiding and Just getting up for first time now Denies headache or visual changes  Objective: Blood pressure 140/95, pulse 97, temperature 98.1 F (36.7 C), temperature source Oral, resp. rate 16, height 5' 2.5" (1.588 m), weight 75.388 kg (166 lb 3.2 oz), last menstrual period 10/02/2012, SpO2 98.00%, unknown if currently breastfeeding.  Filed Vitals:   05/29/13 0300 05/29/13 0400 05/29/13 0500 05/29/13 0600  BP: 130/94 140/97 131/94 140/95  Pulse: 94 96 89 97  Temp:  98.1 F (36.7 C)    TempSrc:  Oral    Resp:      Height:      Weight:   75.388 kg (166 lb 3.2 oz)   SpO2: 99% 98% 98% 98%    Physical Exam:  General: alert, cooperative and no distress Lochia: appropriate Uterine Fundus: firm Incision: n/a DVT Evaluation: No evidence of DVT seen on physical exam.   Recent Labs  05/28/13 1950 05/29/13 0512  HGB 10.0* 8.9*  HCT 30.0* 26.8*   Results for orders placed during the hospital encounter of 05/27/13 (from the past 24 hour(s))  CBC     Status: Abnormal   Collection Time    05/28/13 10:36 AM      Result Value Ref Range   WBC 18.5 (*) 4.0 - 10.5 K/uL   RBC 3.40 (*) 3.87 - 5.11 MIL/uL   Hemoglobin 10.0 (*) 12.0 - 15.0 g/dL   HCT 16.1 (*) 09.6 - 04.5 %   MCV 86.8  78.0 - 100.0 fL   MCH 29.4  26.0 - 34.0 pg   MCHC 33.9  30.0 - 36.0 g/dL   RDW 40.9  81.1 - 91.4 %   Platelets 356  150 - 400 K/uL  CBC     Status: Abnormal   Collection Time    05/28/13  7:50 PM      Result Value Ref Range   WBC 17.0 (*) 4.0 - 10.5 K/uL   RBC 3.43 (*) 3.87 - 5.11 MIL/uL   Hemoglobin 10.0 (*) 12.0 - 15.0 g/dL   HCT 78.2 (*) 95.6 - 21.3 %   MCV 87.5  78.0 - 100.0 fL   MCH 29.2  26.0 - 34.0 pg   MCHC 33.3  30.0 - 36.0 g/dL   RDW 08.6  57.8 - 46.9 %   Platelets 343  150 - 400 K/uL  MRSA PCR SCREENING     Status: None   Collection Time    05/28/13 11:45 PM      Result Value Ref Range   MRSA by PCR NEGATIVE  NEGATIVE  CBC     Status:  Abnormal   Collection Time    05/29/13  5:12 AM      Result Value Ref Range   WBC 19.9 (*) 4.0 - 10.5 K/uL   RBC 3.08 (*) 3.87 - 5.11 MIL/uL   Hemoglobin 8.9 (*) 12.0 - 15.0 g/dL   HCT 62.9 (*) 52.8 - 41.3 %   MCV 87.0  78.0 - 100.0 fL   MCH 28.9  26.0 - 34.0 pg   MCHC 33.2  30.0 - 36.0 g/dL   RDW 24.4  01.0 - 27.2 %   Platelets 349  150 - 400 K/uL    Assessment/Plan: A:  Postpartum Day #1      Severe preeclampsia, superimposed  P:  Continue care      Magnesium sulfate due to be discontinued tonight    LOS: 2 days   [facility]  05/29/2013, 7:55 AM

## 2013-05-29 NOTE — Anesthesia Postprocedure Evaluation (Signed)
Anesthesia Post Note  Patient: Robin ScotlandRakesha Erickson  Procedure(s) Performed: * No procedures listed *  Anesthesia type: Epidural  Patient location: Mother/Baby  Post pain: Pain level controlled  Post assessment: Post-op Vital signs reviewed  Last Vitals:  Filed Vitals:   05/29/13 0900  BP: 143/99  Pulse: 100  Temp:   Resp:     Post vital signs: Reviewed  Level of consciousness: awake  Complications: No apparent anesthesia complications

## 2013-05-30 ENCOUNTER — Ambulatory Visit (HOSPITAL_COMMUNITY): Admission: RE | Admit: 2013-05-30 | Payer: Medicaid Other | Source: Ambulatory Visit

## 2013-05-30 ENCOUNTER — Ambulatory Visit (HOSPITAL_COMMUNITY): Payer: Medicaid Other | Attending: Obstetrics & Gynecology

## 2013-05-30 MED ORDER — INTEGRA F 125-1 MG PO CAPS: 1.0000 | ORAL_CAPSULE | Freq: Every day | ORAL | Status: DC

## 2013-05-30 MED ORDER — IBUPROFEN 600 MG PO TABS: 600.0000 mg | ORAL_TABLET | Freq: Four times a day (QID) | ORAL | Status: DC

## 2013-05-30 MED ORDER — HYDROCHLOROTHIAZIDE 25 MG PO TABS: 25.0000 mg | ORAL_TABLET | Freq: Every day | ORAL | Status: DC

## 2013-05-30 MED ORDER — HYDROCHLOROTHIAZIDE 25 MG PO TABS
25.0000 mg | ORAL_TABLET | Freq: Every day | ORAL | Status: DC
  Administered 2013-05-30: 25 mg via ORAL
  Filled 2013-05-30: qty 1

## 2013-05-30 NOTE — Progress Notes (Signed)
Scheduled labetalol given, will re-check BP 15-30 min.

## 2013-05-30 NOTE — Progress Notes (Signed)
Post Partum Day 2 Subjective: up ad lib, voiding and tolerating PO; undecided regarding family planning.  Denies headache, vision changes, or epigastric pain.  Pumping breasts for infant in NICU.    Objective: Blood pressure 159/95, pulse 64, temperature 98 F (36.7 C), temperature source Oral, resp. rate 16, height 5' 2.5" (1.588 m), weight 74.617 kg (164 lb 8 oz), last menstrual period 10/02/2012, SpO2 100.00%, unknown if currently breastfeeding. Filed Vitals:   05/29/13 2302 05/30/13 0202 05/30/13 0600 05/30/13 0627  BP: 156/97 126/72 164/98 159/95  Pulse: 76 76 64   Temp: 98.1 F (36.7 C) 98.3 F (36.8 C) 98 F (36.7 C)   TempSrc: Oral Oral Oral   Resp: 18 16 16    Height:      Weight:   74.617 kg (164 lb 8 oz)   SpO2: 100% 100% 100%     Physical Exam:  General: alert, cooperative and appears stated age 51Lochia: appropriate Uterine Fundus: firm Incision: n/a DVT Evaluation: No evidence of DVT seen on physical exam. Negative Homan's sign.  Recent Labs  05/28/13 1950 05/29/13 0512  HGB 10.0* 8.9*  HCT 30.0* 26.8*    Assessment/Plan: Chronic Hypertension Normal Postpartum Exam  Plan: HCTZ 25 mg PO Q day Review blood pressures at rounds. Discharge home today tentative.    LOS: 3 days   [facility]UH[facility],Shani Fitch 05/30/2013, 7:49 AM

## 2013-05-30 NOTE — Discharge Summary (Signed)
Obstetric Discharge Summary Reason for Admission:  Pt was admitted at 33.6 wks IUP with a PMH of Chronic hypertension, migraines and PReX with prior pregnancy presenting with recent headaches, peripheral edema, and dizziness, and blurred vision.  IOL for severe preeclampsia.  Prenatal Procedures: NST and ultrasound; BMZ, Magnesium Sulfate IV Intrapartum Procedures: spontaneous vaginal delivery Postpartum Procedures: Magnesium Sulfate x 24 hrs. Complications-Operative and Postpartum: none Hemoglobin  Date Value Ref Range Status  05/29/2013 8.9* 12.0 - 15.0 g/dL Final     HCT  Date Value Ref Range Status  05/29/2013 26.8* 36.0 - 46.0 % Final    Physical Exam:  General: alert, cooperative and appears stated age Lochia: appropriate Uterine Fundus: firm Incision: n/a DVT Evaluation: No evidence of DVT seen on physical exam. Negative Homan's sign.  Discharge Diagnoses: Preelampsia and anemia, and IOL secondary to preeclampsia.  Discharge Information: Date: 05/30/2013 Activity: pelvic rest Diet: routine Medications: Ibuprofen, Iron and HCTZ and labetalol. Condition: improved Instructions: refer to practice specific booklet Discharge to: home Follow-up Information   [facility] [place]. Schedule an appointment as soon as possible for a visit in 1 week.   Specialty:  Obstetrics and Gynecology   Contact information:   [address], [address] [place] [id number] [phone]     Due to blood pressures being uncontrolled with IV meds pt placed on magnesium sulfate and  proceeded with induction of labor s/p betamethasone.   NSVD after cytotec, foley bulb and pitocin.    Newborn Data: Live born female  Birth Weight: 3 lb 15.1 oz (1790 g) APGAR: 3, 5  Infant will remain in NICU.  [facility],Robin Grable 05/30/2013, 9:09 AM

## 2013-05-30 NOTE — Discharge Instructions (Signed)

## 2013-05-30 NOTE — Progress Notes (Signed)
Pt discharged home with significant other... Condition stable... No equipment... Ambulated to car with V. Pittman, NT.  

## 2013-06-01 ENCOUNTER — Ambulatory Visit: Payer: Self-pay

## 2013-06-01 NOTE — Lactation Note (Signed)
This note was copied from the chart of Robin Vanna ScotlandRakesha Sharber. Lactation Consultation Note    Follow up consult with this mom of a NICU baby, now 87 hours post partum, and baby is now 34 4/7 weeks corrected gestation. Mom is doing well with pumping, expresing up to 2-4 ounces, 6 times a day. I advised her to pump no more than every 4 hours times 2 at night, and then every 2-3 hours during the day, to get in at least 8 times a day. I stressed how going more than 4 hours and getting too full , coan decrease her supply . Mom doing skin to skin in the NICU while I spoke to her. Very receptive to information. I will follow this family in the NICU.  Patient Name: Robin Erickson WUJWJ'XToday's Date: 06/01/2013 Reason for consult: Follow-up assessment;NICU baby   Maternal Data    Feeding Feeding Type: Breast Milk Length of feed: 20 min  LATCH Score/Interventions                      Lactation Tools Discussed/Used     Consult Status Consult Status: PRN Follow-up type:  (in NICU)    Alfred LevinsLee, Simrah Chatham Anne 06/01/2013, 10:31 AM

## 2013-06-02 ENCOUNTER — Ambulatory Visit (INDEPENDENT_AMBULATORY_CARE_PROVIDER_SITE_OTHER): Payer: Medicaid Other | Admitting: Obstetrics and Gynecology

## 2013-06-02 ENCOUNTER — Encounter: Payer: Medicaid Other | Admitting: Obstetrics and Gynecology

## 2013-06-02 ENCOUNTER — Encounter: Payer: Self-pay | Admitting: Obstetrics and Gynecology

## 2013-06-02 ENCOUNTER — Ambulatory Visit: Payer: Self-pay

## 2013-06-02 VITALS — BP 138/105 | HR 104 | Temp 97.4°F | Resp 16 | Wt 155.0 lb

## 2013-06-02 DIAGNOSIS — O9122 Nonpurulent mastitis associated with the puerperium: Secondary | ICD-10-CM

## 2013-06-02 DIAGNOSIS — O1002 Pre-existing essential hypertension complicating childbirth: Secondary | ICD-10-CM | POA: Insufficient documentation

## 2013-06-02 MED ORDER — DICLOXACILLIN SODIUM 250 MG PO CAPS: 250.0000 mg | ORAL_CAPSULE | Freq: Four times a day (QID) | ORAL | Status: DC

## 2013-06-02 MED ORDER — AMLODIPINE BESYLATE 10 MG PO TABS: 10.0000 mg | ORAL_TABLET | Freq: Two times a day (BID) | ORAL | Status: DC

## 2013-06-02 NOTE — Patient Instructions (Addendum)
Mastitis   Mastitis is inflammation of the breast tissue. It occurs most often in women who are breastfeeding, but it can also affect other women, and even sometimes men.  CAUSES   Mastitis is usually caused by a bacterial infection. Bacteria enter the breast tissue through cuts or openings in the skin. Typically, this occurs with breastfeeding because of cracked or irritated skin. Sometimes, it can occur even when there is no opening in the skin. It can be associated with plugged milk (lactiferous) ducts. Nipple piercing can also lead to mastitis. Also, some forms of breast cancer can cause mastitis.  SIGNS AND SYMPTOMS   · Swelling, redness, tenderness, and pain in an area of the breast.  · Swelling of the glands under the arm on the same side.  · Fever.  If an infection is allowed to progress, a collection of pus (abscess) may develop.  DIAGNOSIS   Your health care provider can usually diagnose mastitis based on your symptoms and a physical exam. Tests may be done to help confirm the diagnosis. These may include:   · Removal of pus from the breast by applying pressure to the area. This pus can be examined in the lab to determine which bacteria are present. If an abscess has developed, the fluid in the abscess can be removed with a needle. This can also be used to confirm the diagnosis and determine the bacteria present. In most cases, pus will not be present.  · Blood tests to determine if your body is fighting a bacterial infection.  · Mammogram or ultrasound tests to rule out other problems or diseases.  TREATMENT   Antibiotic medicine is used to treat a bacterial infection. Your health care provider will determine which bacteria are most likely causing the infection and will select an appropriate antibiotic. This is sometimes changed based on the results of tests performed to identify the bacteria, or if there is no response to the antibiotic selected. Antibiotics are usually given by mouth. You may also be  given medicine for pain.  Mastitis that occurs with breastfeeding will sometimes go away on its own, so your health care provider may choose to wait 24 hours after first seeing you to decide whether a prescription medicine is needed.  HOME CARE INSTRUCTIONS   · Only take over-the-counter or prescription medicines for pain, fever, or discomfort as directed by your health care provider.  · If your health care provider prescribed an antibiotic, take the medicine as directed. Make sure you finish it even if you start to feel better.  · Do not wear a tight or underwire bra. Wear a soft, supportive bra.  · Increase your fluid intake, especially if you have a fever.  · Women who are breastfeeding should follow these instructions:  · Continue to empty the breast. Your health care provider can tell you whether this milk is safe for your infant or needs to be thrown out. You may be told to stop nursing until your health care provider thinks it is safe for your baby. Use a breast pump if you are advised to stop nursing.  · Keep your nipples clean and dry.  · Empty the first breast completely before going to the other breast. If your baby is not emptying your breasts completely for some reason, use a breast pump to empty your breasts.  · If you go back to work, pump your breasts while at work to stay in time with your nursing schedule.  · Avoid   not improve with the treatment prescribed by your health care provider within 2 days. SEEK IMMEDIATE MEDICAL CARE IF:   Your pain and swelling are getting worse.  You have pain that is not controlled with medicine.  You have a red line extending from the breast toward your armpit.  You have a fever or persistent symptoms for more than 2 3 days.  You have a fever and your symptoms suddenly get worse. Document Released:  03/10/2005 Document Revised: 12/29/2012 Document Reviewed: 10/08/2012 [facility] Patient Information 2014 [place], [place]. Contraception Choices Contraception (birth control) is the use of any methods or devices to prevent pregnancy. Below are some methods to help avoid pregnancy. HORMONAL METHODS   Contraceptive implant This is a thin, plastic tube containing progesterone hormone. It does not contain estrogen hormone. Your health care provider inserts the tube in the inner part of the upper arm. The tube can remain in place for up to 3 years. After 3 years, the implant must be removed. The implant prevents the ovaries from releasing an egg (ovulation), thickens the cervical mucus to prevent sperm from entering the uterus, and thins the lining of the inside of the uterus.  Progesterone-only injections These injections are given every 3 months by your health care provider to prevent pregnancy. This synthetic progesterone hormone stops the ovaries from releasing eggs. It also thickens cervical mucus and changes the uterine lining. This makes it harder for sperm to survive in the uterus.  Birth control pills These pills contain estrogen and progesterone hormone. They work by preventing the ovaries from releasing eggs (ovulation). They also cause the cervical mucus to thicken, preventing the sperm from entering the uterus. Birth control pills are prescribed by a health care provider.Birth control pills can also be used to treat heavy periods.  Minipill This type of birth control pill contains only the progesterone hormone. They are taken every day of each month and must be prescribed by your health care provider.  Birth control patch The patch contains hormones similar to those in birth control pills. It must be changed once a week and is prescribed by a health care provider.  Vaginal ring The ring contains hormones similar to those in birth control pills. It is left in the vagina for 3 weeks, removed for  1 week, and then a new one is put back in place. The patient must be comfortable inserting and removing the ring from the vagina.A health care provider's prescription is necessary.  Emergency contraception Emergency contraceptives prevent pregnancy after unprotected sexual intercourse. This pill can be taken right after sex or up to 5 days after unprotected sex. It is most effective the sooner you take the pills after having sexual intercourse. Most emergency contraceptive pills are available without a prescription. Check with your pharmacist. Do not use emergency contraception as your only form of birth control. BARRIER METHODS   Female condom This is a thin sheath (latex or rubber) that is worn over the penis during sexual intercourse. It can be used with spermicide to increase effectiveness.  Female condom. This is a soft, loose-fitting sheath that is put into the vagina before sexual intercourse.  Diaphragm This is a soft, latex, dome-shaped barrier that must be fitted by a health care provider. It is inserted into the vagina, along with a spermicidal jelly. It is inserted before intercourse. The diaphragm should be left in the vagina for 6 to 8 hours after intercourse.  Cervical cap This is a round, soft, latex or plastic  cup that fits over the cervix and must be fitted by a health care provider. The cap can be left in place for up to 48 hours after intercourse.  Sponge This is a soft, circular piece of polyurethane foam. The sponge has spermicide in it. It is inserted into the vagina after wetting it and before sexual intercourse.  Spermicides These are chemicals that kill or block sperm from entering the cervix and uterus. They come in the form of creams, jellies, suppositories, foam, or tablets. They do not require a prescription. They are inserted into the vagina with an applicator before having sexual intercourse. The process must be repeated every time you have sexual  intercourse. INTRAUTERINE CONTRACEPTION  Intrauterine device (IUD) This is a T-shaped device that is put in a woman's uterus during a menstrual period to prevent pregnancy. There are 2 types:  Copper IUD This type of IUD is wrapped in copper wire and is placed inside the uterus. Copper makes the uterus and fallopian tubes produce a fluid that kills sperm. It can stay in place for 10 years.  Hormone IUD This type of IUD contains the hormone progestin (synthetic progesterone). The hormone thickens the cervical mucus and prevents sperm from entering the uterus, and it also thins the uterine lining to prevent implantation of a fertilized egg. The hormone can weaken or kill the sperm that get into the uterus. It can stay in place for 3 5 years, depending on which type of IUD is used. PERMANENT METHODS OF CONTRACEPTION  Female tubal ligation This is when the woman's fallopian tubes are surgically sealed, tied, or blocked to prevent the egg from traveling to the uterus.  Hysteroscopic sterilization This involves placing a small coil or insert into each fallopian tube. Your doctor uses a technique called hysteroscopy to do the procedure. The device causes scar tissue to form. This results in permanent blockage of the fallopian tubes, so the sperm cannot fertilize the egg. It takes about 3 months after the procedure for the tubes to become blocked. You must use another form of birth control for these 3 months.  Female sterilization This is when the female has the tubes that carry sperm tied off (vasectomy).This blocks sperm from entering the vagina during sexual intercourse. After the procedure, the man can still ejaculate fluid (semen). NATURAL PLANNING METHODS  Natural family planning This is not having sexual intercourse or using a barrier method (condom, diaphragm, cervical cap) on days the woman could become pregnant.  Calendar method This is keeping track of the length of each menstrual cycle and  identifying when you are fertile.  Ovulation method This is avoiding sexual intercourse during ovulation.  Symptothermal method This is avoiding sexual intercourse during ovulation, using a thermometer and ovulation symptoms.  Post ovulation method This is timing sexual intercourse after you have ovulated. Regardless of which type or method of contraception you choose, it is important that you use condoms to protect against the transmission of sexually transmitted infections (STIs). Talk with your health care provider about which form of contraception is most appropriate for you. Document Released: 03/10/2005 Document Revised: 11/10/2012 Document Reviewed: 09/02/2012 [facility] Patient Information 2014 [place].

## 2013-06-02 NOTE — Lactation Note (Signed)
This note was copied from the chart of Robin Vanna ScotlandRakesha Ribas. Lactation Consultation Note     Follow up consult with this mom of a NICU baby, now 745 days old, and baby is 6934 5/7 weeks corrected gestation. Baby is latching easily to mom , strong suckles. I showed mom how to latch in cross cradle hold, and told mom I will continue to work with transitioning her baby to full breast feeding. Mom has been pumping with a hand pump. She called WIC, but did not  mention that the baby was in NICU, so was not given a  DEP. I advised her  to recall WIC, let them know her baby was in NICU, and set up a time to pick up a DEP. Mom has a tender area  In her left breast, lower, outer quadrant was full, and warm. I assisted mom with pumping and massage, and was able to bette r empty that area. Mom reports her breast feeling better. She was seeing her OB MD today, so I suggested she make her doctor aware of her breast tenderness, in case it might be the beginnings of a mastitis.  I will follow this family in the NICU  Patient Name: Robin Erickson Robin Erickson: 06/02/2013 Reason for consult: Follow-up assessment   Maternal Data    Feeding Feeding Type: Breast Fed Length of feed: 30 min  LATCH Score/Interventions Latch: Grasps breast easily, tongue down, lips flanged, rhythmical sucking.  Audible Swallowing: Spontaneous and intermittent  Type of Nipple: Everted at rest and after stimulation  Comfort (Breast/Nipple): Soft / non-tender (tender, re, warm area of left breast - lower outside quadrant - possible mastitis)     Hold (Positioning): Assistance needed to correctly position infant at breast and maintain latch.  LATCH Score: 9  Lactation Tools Discussed/Used     Consult Status Consult Status: PRN Follow-up type:  (in NICU)    Alfred LevinsLee, Margot Oriordan Anne 06/02/2013, 1:43 PM

## 2013-06-02 NOTE — Lactation Note (Signed)
This note was copied from the chart of Robin Erickson. Lactation Consultation Note       Telephone call to parents, to tell them [facility], Cathlyn ParsonsNedra cox. Mom is not active with WIC, and needs to call for an appointment to apply. Once she does this, I will be able to loan mom a [employer] DEP.  Mom was started on antibiotics by had doctor today.  I will follow up with this family in the NICU  Patient Name: Robin Vanna ScotlandRakesha Muff ZOXWR'UToday's Date: 06/02/2013 Reason for consult: Follow-up assessment;NICU baby   Maternal Data    Feeding Feeding Type: Breast Fed Length of feed: 30 min  LATCH Score/Interventions Latch: Grasps breast easily, tongue down, lips flanged, rhythmical sucking.  Audible Swallowing: Spontaneous and intermittent  Type of Nipple: Everted at rest and after stimulation  Comfort (Breast/Nipple): Soft / non-tender (tender, re, warm area of left breast - lower outside quadrant - possible mastitis)     Hold (Positioning): Assistance needed to correctly position infant at breast and maintain latch.  LATCH Score: 9  Lactation Tools Discussed/Used WIC Program: No (mom encouraged to call for appointment)   Consult Status Consult Status: Follow-up Date: 06/03/13 Follow-up type: In-patient    Alfred LevinsLee, Arnold Depinto Anne 06/02/2013, 2:59 PM

## 2013-06-03 ENCOUNTER — Ambulatory Visit: Payer: Self-pay

## 2013-06-03 NOTE — Lactation Note (Signed)
This note was copied from the chart of Robin Erickson. Lactation Consultation Note     Follow up consult on this mom of a NICU baby, 34 6/7 weeks corrected gestation, and 746 days old. Mom was started on antibiotics yesterday for mastitis. She reports she is already starting to feel better.  Wic was faxed information on mom, and mom has left message, for an appointment to apply. With this information, i loaned mom a DEP. I will follow this family in the nICU  Patient Name: Robin Vanna ScotlandRakesha Mallinger ZOXWR'UToday's Date: 06/03/2013 Reason for consult: Follow-up assessment   Maternal Data    Feeding Feeding Type: Breast Milk Nipple Type: Slow - flow Length of feed: 30 min  LATCH Score/Interventions                      Lactation Tools Discussed/Used WIC Program: No ([facility])   Consult Status Consult Status: PRN Follow-up type:  (in NICU)    Alfred LevinsLee, Caralina Nop Anne 06/03/2013, 4:57 PM

## 2013-06-06 ENCOUNTER — Ambulatory Visit (HOSPITAL_COMMUNITY): Payer: Medicaid Other

## 2013-06-06 ENCOUNTER — Other Ambulatory Visit (HOSPITAL_COMMUNITY): Payer: Medicaid Other

## 2013-06-07 ENCOUNTER — Ambulatory Visit: Payer: Self-pay

## 2013-06-07 NOTE — Lactation Note (Signed)
This note was copied from the chart of Robin Vanna ScotlandRakesha Foisy. Lactation Consultation Note    Brief follow up consult with this mom of a NICU baby, now 169 days old. Mom is still on antibiotics, and is feeling much better. With the DEP she is expressing lots of milk, as much as 8-14 ounces at a time. I will follow this family in the NICU.  Patient Name: Robin Erickson NFAOZ'HToday's Date: 06/07/2013     Maternal Data    Feeding Feeding Type: Breast Milk Nipple Type: Slow - flow Length of feed: 15 min  LATCH Score/Interventions                      Lactation Tools Discussed/Used     Consult Status      Alfred LevinsLee, Hajar Penninger Anne 06/07/2013, 2:48 PM

## 2013-06-08 NOTE — Progress Notes (Signed)
CC: Follow-up     HPI Robin Erickson is a 28 y.o. [id number] 619w0d who presents 6d PP from IOL for CHTN with severe preE at 34wks for BP recheck and breast pain. Taking Labetalol mg 300 tid.  Expressing and working with LCs while baby in NICU. Left breast painful, engorged. Taking ibuprofen. No other PP concerns; lochia tapering. Has home support.  Past Medical History  Diagnosis Date  . Hypertension   . Abnormal Pap smear     OB History  Gravida Para Term Preterm AB SAB TAB Ectopic Multiple Living  3 3 2 1      3     # Outcome Date GA Lbr Len/2nd Weight Sex Delivery Anes PTL Lv  3 PRE 05/28/13 769w0d 10:50 / 00:02 3 lb 15.1 oz (1.79 kg) F SVD EPI  Y  2 TRM 08/01/05 4335w0d  6 lb 11 oz (3.033 kg) F SVD EPI N Y  1 TRM 09/27/00 3039w0d  7 lb (3.175 kg) M SVD EPI N Y      Past Surgical History  Procedure Laterality Date  . Wisdom tooth extraction    . Eye surgery      age 89    History   Social History  . Marital Status: Single    Spouse Name: N/A    Number of Children: N/A  . Years of Education: N/A   Occupational History  . homemaker    Social History Main Topics  . Smoking status: Never Smoker   . Smokeless tobacco: Never Used  . Alcohol Use: No  . Drug Use: No  . Sexual Activity: Yes    Partners: Male    Birth Control/ Protection: None   Other Topics Concern  . Not on file   Social History Narrative  . No narrative on file      No Known Allergies  ROS Pertinent items in HPI  PHYSICAL EXAM Filed Vitals:   06/02/13 1409  BP: 138/105  Pulse: 104  Temp:   Resp:    General: Well nourished, well developed female in apparent mild discomfort  Breasts: mild engorgement. Left breast has wedge-shaped area of tenderness with redness of overlying skin @2 -4 o'clock. Nipples and areola not inflamed Cardiovascular: Normal rate Respiratory: Normal effort Abdomen: Soft, nontender, well involuted uterus Back: No CVAT Extremities: Tr edema Neurologic: Alert and  oriented    ASSESSMENT  1. Mastitis, obstetric, delivered with postpartum condition   2. Benign essential hypertension with delivery     PLAN C/W Dr. Macon LargeAnyanwu re: antihypertensive med change> will switch to Norvasc  Rx Dicloxicillin for mastitis    Medication List       This list is accurate as of: 06/02/13 11:59 PM.  Always use your most recent med list.               amLODipine 10 MG tablet  Commonly known as:  NORVASC  Take 1 tablet (10 mg total) by mouth 2 (two) times daily.     calcium carbonate 500 MG chewable tablet  Commonly known as:  TUMS - dosed in mg elemental calcium  Chew 2 tablets by mouth 3 (three) times daily as needed for indigestion or heartburn.     dicloxacillin 250 MG capsule  Commonly known as:  DYNAPEN  Take 1 capsule (250 mg total) by mouth 4 (four) times daily.     hydrochlorothiazide 25 MG tablet  Commonly known as:  HYDRODIURIL  Take 1 tablet (25 mg total) by mouth daily.  ibuprofen 600 MG tablet  Commonly known as:  ADVIL,MOTRIN  Take 1 tablet (600 mg total) by mouth every 6 (six) hours.     INTEGRA F 125-1 MG Caps  Take 1 tablet by mouth daily.     labetalol 200 MG tablet  Commonly known as:  NORMODYNE  Take 1.5 tablets (300 mg total) by mouth 3 (three) times daily.     promethazine 25 MG tablet  Commonly known as:  PHENERGAN  Take 1 tablet (25 mg total) by mouth every 6 (six) hours as needed for nausea.       Follow- up at 6wks at [facility], CNM 06/08/2013 9:55 AM (late entry for progress note) Addendum: TC to pt: taking Diclox and much improved.

## 2013-06-13 ENCOUNTER — Other Ambulatory Visit (HOSPITAL_COMMUNITY): Payer: Medicaid Other

## 2013-06-13 ENCOUNTER — Ambulatory Visit (HOSPITAL_COMMUNITY): Payer: Medicaid Other

## 2013-06-15 ENCOUNTER — Encounter (HOSPITAL_COMMUNITY)
Admission: RE | Admit: 2013-06-15 | Discharge: 2013-06-15 | Disposition: A | Discharge status: Home / Self Care | Payer: Medicaid Other | Source: Ambulatory Visit | Attending: Obstetrics & Gynecology | Admitting: Obstetrics & Gynecology

## 2013-06-15 DIAGNOSIS — O923 Agalactia: Secondary | ICD-10-CM | POA: Insufficient documentation

## 2013-06-16 ENCOUNTER — Telehealth: Payer: Self-pay | Admitting: *Deleted

## 2013-06-16 DIAGNOSIS — O1002 Pre-existing essential hypertension complicating childbirth: Secondary | ICD-10-CM

## 2013-06-16 MED ORDER — VERAPAMIL HCL ER 100 MG PO CP24: 100.0000 mg | ORAL_CAPSULE | Freq: Every day | ORAL | Status: DC

## 2013-06-16 NOTE — Telephone Encounter (Signed)
  Spoke with Robin HeraldBarbara Carder Lactation consult called stating that pt had delivered prematurely and was currently on Norvasc and that was not the drug of choose.  Per Dr Marice Potterove may switch for Verapamil 100 mg every night.  She is to come in 4 weeks for BP check.  Pt probably needs to be followed by PMD for hypertension outside of pregnancy.

## 2013-06-20 ENCOUNTER — Telehealth: Payer: Self-pay | Admitting: *Deleted

## 2013-06-20 DIAGNOSIS — I10 Essential (primary) hypertension: Secondary | ICD-10-CM

## 2013-06-20 MED ORDER — VERAPAMIL HCL ER 120 MG PO CP24: 120.0000 mg | ORAL_CAPSULE | Freq: Every day | ORAL | Status: DC

## 2013-06-20 NOTE — Telephone Encounter (Signed)
Rcvd call from pharm requesting prior auth for Verapamil 100mg  ER. Called insurance co, which stated no prior auth needed. Spoke to pharmacist who suggested 120mg . Verapamil 120mg  ER is a category L2. Pt is breastfeeding. Will change script to 120mg  ER per Dr. Marice Potterove.

## 2013-07-07 ENCOUNTER — Encounter: Payer: Self-pay | Admitting: Obstetrics & Gynecology

## 2013-07-07 ENCOUNTER — Ambulatory Visit (INDEPENDENT_AMBULATORY_CARE_PROVIDER_SITE_OTHER): Payer: Medicaid Other | Admitting: Obstetrics & Gynecology

## 2013-07-07 DIAGNOSIS — I10 Essential (primary) hypertension: Secondary | ICD-10-CM

## 2013-07-07 DIAGNOSIS — Z3049 Encounter for surveillance of other contraceptives: Secondary | ICD-10-CM

## 2013-07-07 MED ORDER — MEDROXYPROGESTERONE ACETATE 150 MG/ML IM SUSP
150.0000 mg | Freq: Once | INTRAMUSCULAR | Status: AC
  Administered 2013-07-07: 150 mg via INTRAMUSCULAR

## 2013-07-07 NOTE — Progress Notes (Signed)
   Subjective:    Patient ID: Robin Erickson, female    DOB: 10/30/1985, 28 y.o.   MRN: [id number]  HPI  28 yo lady here for 4 week pp visit. She was induced for severe preeclampsia at 34 weeks. Her daughter stayed in the NICU for 2 weeks and has now been home for 2 weeks. She sleeps well and has almost doubled her birth weight. Ms. Sherrine MaplesGlenn reports that she has good support at home. She denies depression, SI, and HI. She reports that she was stressed bringing home a preemie but that stress is declining. She has not had sex since delivery and wants to use depo provera for contraception. She reports normal bowel and bladder function. She had no tears with delivery.  Review of Systems Pap normal 9/14    Objective:   Physical Exam        Assessment & Plan:  PP- stable HTN- she will follow up with her primary care MD for management Contraception- Depo provera now and in 12 weeks

## 2013-07-16 ENCOUNTER — Encounter (HOSPITAL_COMMUNITY)
Admission: RE | Admit: 2013-07-16 | Discharge: 2013-07-16 | Disposition: A | Discharge status: Home / Self Care | Payer: Medicaid Other | Source: Ambulatory Visit | Attending: Obstetrics & Gynecology | Admitting: Obstetrics & Gynecology

## 2013-07-16 DIAGNOSIS — O923 Agalactia: Secondary | ICD-10-CM | POA: Insufficient documentation

## 2013-08-16 ENCOUNTER — Encounter (HOSPITAL_COMMUNITY)
Admission: RE | Admit: 2013-08-16 | Discharge: 2013-08-16 | Disposition: A | Discharge status: Home / Self Care | Payer: Medicaid Other | Source: Ambulatory Visit | Attending: Obstetrics & Gynecology | Admitting: Obstetrics & Gynecology

## 2013-08-16 DIAGNOSIS — O923 Agalactia: Secondary | ICD-10-CM | POA: Insufficient documentation

## 2013-09-16 ENCOUNTER — Encounter (HOSPITAL_COMMUNITY)
Admission: RE | Admit: 2013-09-16 | Discharge: 2013-09-16 | Disposition: A | Discharge status: Home / Self Care | Payer: Medicaid Other | Source: Ambulatory Visit | Attending: Obstetrics & Gynecology | Admitting: Obstetrics & Gynecology

## 2013-09-16 DIAGNOSIS — O923 Agalactia: Secondary | ICD-10-CM | POA: Insufficient documentation

## 2013-09-29 ENCOUNTER — Ambulatory Visit (INDEPENDENT_AMBULATORY_CARE_PROVIDER_SITE_OTHER): Payer: Medicaid Other | Admitting: *Deleted

## 2013-09-29 VITALS — BP 124/78 | HR 68

## 2013-09-29 DIAGNOSIS — Z3049 Encounter for surveillance of other contraceptives: Secondary | ICD-10-CM

## 2013-09-29 DIAGNOSIS — Z3042 Encounter for surveillance of injectable contraceptive: Secondary | ICD-10-CM

## 2013-09-29 MED ORDER — MEDROXYPROGESTERONE ACETATE 150 MG/ML IM SUSP
150.0000 mg | INTRAMUSCULAR | Status: AC
  Administered 2013-09-29: 150 mg via INTRAMUSCULAR

## 2013-10-16 ENCOUNTER — Encounter (HOSPITAL_COMMUNITY)
Admission: RE | Admit: 2013-10-16 | Discharge: 2013-10-16 | Disposition: A | Discharge status: Home / Self Care | Payer: Medicaid Other | Source: Ambulatory Visit | Attending: Obstetrics & Gynecology | Admitting: Obstetrics & Gynecology

## 2013-10-16 DIAGNOSIS — O923 Agalactia: Secondary | ICD-10-CM | POA: Diagnosis not present

## 2013-11-16 ENCOUNTER — Encounter (HOSPITAL_COMMUNITY)
Admission: RE | Admit: 2013-11-16 | Discharge: 2013-11-16 | Disposition: A | Discharge status: Home / Self Care | Payer: Medicaid Other | Source: Ambulatory Visit | Attending: Obstetrics & Gynecology | Admitting: Obstetrics & Gynecology

## 2013-11-16 DIAGNOSIS — O923 Agalactia: Secondary | ICD-10-CM | POA: Insufficient documentation

## 2013-12-17 ENCOUNTER — Encounter (HOSPITAL_COMMUNITY)
Admission: RE | Admit: 2013-12-17 | Discharge: 2013-12-17 | Disposition: A | Discharge status: Home / Self Care | Payer: Medicaid Other | Source: Ambulatory Visit | Attending: Obstetrics & Gynecology | Admitting: Obstetrics & Gynecology

## 2013-12-17 DIAGNOSIS — O923 Agalactia: Secondary | ICD-10-CM | POA: Insufficient documentation

## 2013-12-22 ENCOUNTER — Ambulatory Visit (INDEPENDENT_AMBULATORY_CARE_PROVIDER_SITE_OTHER): Payer: Medicaid Other | Admitting: *Deleted

## 2013-12-22 DIAGNOSIS — Z3042 Encounter for surveillance of injectable contraceptive: Secondary | ICD-10-CM

## 2014-01-16 ENCOUNTER — Encounter (HOSPITAL_COMMUNITY)
Admission: RE | Admit: 2014-01-16 | Discharge: 2014-01-16 | Disposition: A | Discharge status: Home / Self Care | Payer: Medicaid Other | Source: Ambulatory Visit | Attending: Obstetrics & Gynecology | Admitting: Obstetrics & Gynecology

## 2014-01-16 DIAGNOSIS — O923 Agalactia: Secondary | ICD-10-CM | POA: Insufficient documentation

## 2014-01-23 ENCOUNTER — Encounter: Payer: Self-pay | Admitting: Obstetrics & Gynecology

## 2014-02-17 ENCOUNTER — Encounter (HOSPITAL_COMMUNITY)
Admission: RE | Admit: 2014-02-17 | Discharge: 2014-02-17 | Disposition: A | Discharge status: Home / Self Care | Payer: Medicaid Other | Source: Ambulatory Visit | Attending: Obstetrics & Gynecology | Admitting: Obstetrics & Gynecology

## 2014-02-17 DIAGNOSIS — O923 Agalactia: Secondary | ICD-10-CM | POA: Diagnosis not present

## 2014-03-15 ENCOUNTER — Other Ambulatory Visit (HOSPITAL_COMMUNITY)
Admission: RE | Admit: 2014-03-15 | Discharge: 2014-03-15 | Disposition: A | Discharge status: Home / Self Care | Payer: Medicaid Other | Source: Ambulatory Visit | Attending: Obstetrics & Gynecology | Admitting: Obstetrics & Gynecology

## 2014-03-15 ENCOUNTER — Ambulatory Visit (INDEPENDENT_AMBULATORY_CARE_PROVIDER_SITE_OTHER): Payer: Medicaid Other | Admitting: Obstetrics & Gynecology

## 2014-03-15 ENCOUNTER — Encounter: Payer: Self-pay | Admitting: Obstetrics & Gynecology

## 2014-03-15 VITALS — BP 155/103 | HR 100 | Resp 16 | Ht 63.0 in | Wt 164.0 lb

## 2014-03-15 DIAGNOSIS — Z01419 Encounter for gynecological examination (general) (routine) without abnormal findings: Secondary | ICD-10-CM | POA: Insufficient documentation

## 2014-03-15 DIAGNOSIS — Z113 Encounter for screening for infections with a predominantly sexual mode of transmission: Secondary | ICD-10-CM | POA: Insufficient documentation

## 2014-03-15 DIAGNOSIS — Z3042 Encounter for surveillance of injectable contraceptive: Secondary | ICD-10-CM

## 2014-03-15 DIAGNOSIS — Z0000 Encounter for general adult medical examination without abnormal findings: Secondary | ICD-10-CM

## 2014-03-15 MED ORDER — MEDROXYPROGESTERONE ACETATE 150 MG/ML IM SUSP
150.0000 mg | Freq: Once | INTRAMUSCULAR | Status: AC
  Administered 2014-03-15: 150 mg via INTRAMUSCULAR

## 2014-03-15 NOTE — Progress Notes (Signed)
Subjective:    Robin Erickson is a 28 y.o. SAA P3 (9 month, 8 and 29 yo children) female who presents for an annual exam. The patient has no complaints today except that when she gets her depo provera she bleeds the month after she gets the shot. She does not want more kids and is considering a BTL.The patient is sexually active. GYN screening history: last pap: was normal. The patient wears seatbelts: yes. The patient participates in regular exercise: no. Has the patient ever been transfused or tattooed?: yes. The patient reports that there is not domestic violence in her life.   Menstrual History: OB History    Gravida Para Term Preterm AB TAB SAB Ectopic Multiple Living   3 3 2 1      3       Menarche age: [age]  No LMP recorded. Patient has had an injection.    The following portions of the patient's history were reviewed and updated as appropriate: allergies, current medications, past family history, past medical history, past social history, past surgical history and problem list.  Review of Systems A comprehensive review of systems was negative. Monogamous for 2 1/2 years, they live together. Wants a flu vaccine today. Has not seen doctor about HTN yet.   Objective:    BP 155/103 mmHg  Pulse 100  Resp 16  Ht 5\' 3"  (1.6 m)  Wt 164 lb (74.39 kg)  BMI 29.06 kg/m2  Breastfeeding? No  General Appearance:    Alert, cooperative, no distress, appears stated age  Head:    Normocephalic, without obvious abnormality, atraumatic  Eyes:    PERRL, conjunctiva/corneas clear, EOM's intact, fundi    benign, both eyes  Ears:    Normal TM's and external ear canals, both ears  Nose:   Nares normal, septum midline, mucosa normal, no drainage    or sinus tenderness  Throat:   Lips, mucosa, and tongue normal; teeth and gums normal  Neck:   Supple, symmetrical, trachea midline, no adenopathy;    thyroid:  no enlargement/tenderness/nodules; no carotid   bruit or JVD  Back:     Symmetric, no  curvature, ROM normal, no CVA tenderness  Lungs:     Clear to auscultation bilaterally, respirations unlabored  Chest Wall:    No tenderness or deformity   Heart:    Regular rate and rhythm, S1 and S2 normal, no murmur, rub   or gallop  Breast Exam:    No tenderness, masses, or nipple abnormality  Abdomen:     Soft, non-tender, bowel sounds active all four quadrants,    no masses, no organomegaly  Genitalia:    Normal female without lesion, discharge or tenderness     Extremities:   Extremities normal, atraumatic, no cyanosis or edema  Pulses:   2+ and symmetric all extremities  Skin:   Skin color, texture, turgor normal, no rashes or lesions  Lymph nodes:   Cervical, supraclavicular, and axillary nodes normal  Neurologic:   CNII-XII intact, normal strength, sensation and reflexes    throughout  .    Assessment:    Healthy female exam.    Plan:     Pap STI testing per patient request Flu vaccine Sign [facility] BTL papers and send Robin Erickson an email to schedule BTL

## 2014-03-16 LAB — HIV ANTIBODY (ROUTINE TESTING W REFLEX): HIV 1&2 Ab, 4th Generation: NONREACTIVE

## 2014-03-16 LAB — CYTOLOGY - PAP

## 2014-03-16 LAB — HEPATITIS C ANTIBODY: HCV Ab: NEGATIVE

## 2014-03-16 LAB — HEPATITIS B SURFACE ANTIGEN: HEP B S AG: NEGATIVE

## 2014-03-16 LAB — RPR

## 2014-03-18 ENCOUNTER — Encounter (HOSPITAL_COMMUNITY)
Admission: RE | Admit: 2014-03-18 | Discharge: 2014-03-18 | Disposition: A | Discharge status: Home / Self Care | Payer: Medicaid Other | Source: Ambulatory Visit | Attending: Obstetrics & Gynecology | Admitting: Obstetrics & Gynecology

## 2014-03-18 DIAGNOSIS — O923 Agalactia: Secondary | ICD-10-CM | POA: Insufficient documentation

## 2014-03-21 ENCOUNTER — Encounter: Payer: Self-pay | Admitting: *Deleted

## 2014-03-22 ENCOUNTER — Ambulatory Visit: Payer: Medicaid Other | Admitting: Family Medicine

## 2014-05-08 ENCOUNTER — Inpatient Hospital Stay (HOSPITAL_COMMUNITY): Admission: RE | Admit: 2014-05-08 | Payer: Medicaid Other | Source: Ambulatory Visit

## 2014-05-10 ENCOUNTER — Encounter (HOSPITAL_COMMUNITY): Payer: Self-pay

## 2014-05-10 ENCOUNTER — Encounter (HOSPITAL_COMMUNITY)
Admission: RE | Admit: 2014-05-10 | Discharge: 2014-05-10 | Disposition: A | Discharge status: Home / Self Care | Payer: Medicaid Other | Source: Ambulatory Visit | Attending: Obstetrics & Gynecology | Admitting: Obstetrics & Gynecology

## 2014-05-10 DIAGNOSIS — Z01818 Encounter for other preprocedural examination: Secondary | ICD-10-CM | POA: Diagnosis not present

## 2014-05-10 DIAGNOSIS — Z302 Encounter for sterilization: Secondary | ICD-10-CM | POA: Diagnosis not present

## 2014-05-10 HISTORY — DX: Cardiac murmur, unspecified: R01.1

## 2014-05-10 HISTORY — DX: Headache: R51

## 2014-05-10 HISTORY — DX: Headache, unspecified: R51.9

## 2014-05-10 HISTORY — DX: Anxiety disorder, unspecified: F41.9

## 2014-05-10 LAB — BASIC METABOLIC PANEL
Anion gap: 4 — ABNORMAL LOW (ref 5–15)
BUN: 8 mg/dL (ref 6–23)
CALCIUM: 8.9 mg/dL (ref 8.4–10.5)
CO2: 25 mmol/L (ref 19–32)
Chloride: 108 mmol/L (ref 96–112)
Creatinine, Ser: 0.71 mg/dL (ref 0.50–1.10)
Glucose, Bld: 89 mg/dL (ref 70–99)
Potassium: 4.1 mmol/L (ref 3.5–5.1)
SODIUM: 137 mmol/L (ref 135–145)

## 2014-05-10 LAB — CBC
HCT: 33.6 % — ABNORMAL LOW (ref 36.0–46.0)
Hemoglobin: 10.9 g/dL — ABNORMAL LOW (ref 12.0–15.0)
MCH: 27.9 pg (ref 26.0–34.0)
MCHC: 32.4 g/dL (ref 30.0–36.0)
MCV: 85.9 fL (ref 78.0–100.0)
PLATELETS: 529 10*3/uL — AB (ref 150–400)
RBC: 3.91 MIL/uL (ref 3.87–5.11)
RDW: 14.4 % (ref 11.5–15.5)
WBC: 10.9 10*3/uL — ABNORMAL HIGH (ref 4.0–10.5)

## 2014-05-10 NOTE — Patient Instructions (Addendum)
Your procedure is scheduled on:  Wednesday, May 17, 2014  [facility]:  2:45 p.m.  Pick up the phone at the desk and dial [phone].  Call this number if you have problems the morning of surgery: [phone].  Remember: Do NOT eat food:  AFTER MIDNIGHT TUESDAY, May 16, 2014 Do NOT drink clear liquids after:  AFTER 12 NOON DAY OF SURGERY Take these medicines the morning of surgery with a SIP OF WATER: None  Do NOT wear jewelry (body piercing), metal hair clips/bobby pins, make-up, or nail polish. Do NOT wear lotions, powders, or perfumes.  You may wear deoderant. Do NOT shave for 48 hours prior to surgery. Do NOT bring valuables to the hospital. Contacts, dentures, or bridgework may not be worn into surgery. Have a responsible adult drive you home and stay with you for 24 hours after your procedure  No cellphones are allowed in any procedural areas!!!

## 2014-05-17 ENCOUNTER — Ambulatory Visit (HOSPITAL_COMMUNITY): Payer: Medicaid Other | Admitting: Anesthesiology

## 2014-05-17 ENCOUNTER — Encounter (HOSPITAL_COMMUNITY): Payer: Self-pay | Admitting: *Deleted

## 2014-05-17 ENCOUNTER — Encounter (HOSPITAL_COMMUNITY)
Admission: RE | Disposition: A | Discharge status: Home / Self Care | Payer: Self-pay | Source: Ambulatory Visit | Attending: Obstetrics & Gynecology

## 2014-05-17 ENCOUNTER — Ambulatory Visit (HOSPITAL_COMMUNITY)
Admission: RE | Admit: 2014-05-17 | Discharge: 2014-05-17 | Disposition: A | Discharge status: Home / Self Care | Payer: Medicaid Other | Source: Ambulatory Visit | Attending: Obstetrics & Gynecology | Admitting: Obstetrics & Gynecology

## 2014-05-17 DIAGNOSIS — Z641 Problems related to multiparity: Secondary | ICD-10-CM | POA: Insufficient documentation

## 2014-05-17 DIAGNOSIS — Z302 Encounter for sterilization: Secondary | ICD-10-CM | POA: Insufficient documentation

## 2014-05-17 DIAGNOSIS — Z79899 Other long term (current) drug therapy: Secondary | ICD-10-CM | POA: Insufficient documentation

## 2014-05-17 DIAGNOSIS — E669 Obesity, unspecified: Secondary | ICD-10-CM | POA: Diagnosis not present

## 2014-05-17 DIAGNOSIS — I10 Essential (primary) hypertension: Secondary | ICD-10-CM | POA: Diagnosis not present

## 2014-05-17 DIAGNOSIS — Z791 Long term (current) use of non-steroidal anti-inflammatories (NSAID): Secondary | ICD-10-CM | POA: Insufficient documentation

## 2014-05-17 DIAGNOSIS — Z8249 Family history of ischemic heart disease and other diseases of the circulatory system: Secondary | ICD-10-CM | POA: Diagnosis not present

## 2014-05-17 DIAGNOSIS — G43909 Migraine, unspecified, not intractable, without status migrainosus: Secondary | ICD-10-CM | POA: Insufficient documentation

## 2014-05-17 DIAGNOSIS — Z862 Personal history of diseases of the blood and blood-forming organs and certain disorders involving the immune mechanism: Secondary | ICD-10-CM | POA: Insufficient documentation

## 2014-05-17 DIAGNOSIS — Z7982 Long term (current) use of aspirin: Secondary | ICD-10-CM | POA: Insufficient documentation

## 2014-05-17 HISTORY — PX: LAPAROSCOPIC BILATERAL SALPINGECTOMY: SHX5889

## 2014-05-17 LAB — PREGNANCY, URINE: Preg Test, Ur: NEGATIVE

## 2014-05-17 SURGERY — SALPINGECTOMY, BILATERAL, LAPAROSCOPIC
Anesthesia: General | Site: Abdomen | Laterality: Bilateral

## 2014-05-17 MED ORDER — FENTANYL CITRATE 0.05 MG/ML IJ SOLN
INTRAMUSCULAR | Status: DC | PRN
  Administered 2014-05-17 (×3): 50 ug via INTRAVENOUS
  Administered 2014-05-17: 100 ug via INTRAVENOUS

## 2014-05-17 MED ORDER — SCOPOLAMINE 1 MG/3DAYS TD PT72
MEDICATED_PATCH | TRANSDERMAL | Status: AC
  Administered 2014-05-17: 1.5 mg via TRANSDERMAL
  Filled 2014-05-17: qty 1

## 2014-05-17 MED ORDER — BUPIVACAINE HCL (PF) 0.5 % IJ SOLN
INTRAMUSCULAR | Status: DC | PRN
  Administered 2014-05-17: 30 mL

## 2014-05-17 MED ORDER — MEPERIDINE HCL 25 MG/ML IJ SOLN: 6.2500 mg | INTRAMUSCULAR | Status: DC | PRN

## 2014-05-17 MED ORDER — SCOPOLAMINE 1 MG/3DAYS TD PT72
1.0000 | MEDICATED_PATCH | Freq: Once | TRANSDERMAL | Status: DC
  Administered 2014-05-17: 1.5 mg via TRANSDERMAL

## 2014-05-17 MED ORDER — MIDAZOLAM HCL 2 MG/2ML IJ SOLN
INTRAMUSCULAR | Status: DC | PRN
  Administered 2014-05-17: 2 mg via INTRAVENOUS

## 2014-05-17 MED ORDER — HYDROMORPHONE HCL 1 MG/ML IJ SOLN: 0.2500 mg | INTRAMUSCULAR | Status: DC | PRN

## 2014-05-17 MED ORDER — GLYCOPYRROLATE 0.2 MG/ML IJ SOLN
INTRAMUSCULAR | Status: AC
  Filled 2014-05-17: qty 2

## 2014-05-17 MED ORDER — FENTANYL CITRATE 0.05 MG/ML IJ SOLN
INTRAMUSCULAR | Status: AC
  Filled 2014-05-17: qty 5

## 2014-05-17 MED ORDER — OXYCODONE-ACETAMINOPHEN 5-325 MG PO TABS: 1.0000 | ORAL_TABLET | Freq: Four times a day (QID) | ORAL | Status: DC | PRN

## 2014-05-17 MED ORDER — LIDOCAINE HCL (CARDIAC) 20 MG/ML IV SOLN
INTRAVENOUS | Status: AC
  Filled 2014-05-17: qty 5

## 2014-05-17 MED ORDER — PROPOFOL 10 MG/ML IV BOLUS
INTRAVENOUS | Status: DC | PRN
  Administered 2014-05-17: 180 mg via INTRAVENOUS

## 2014-05-17 MED ORDER — NEOSTIGMINE METHYLSULFATE 10 MG/10ML IV SOLN
INTRAVENOUS | Status: DC | PRN
  Administered 2014-05-17: 3 mg via INTRAVENOUS

## 2014-05-17 MED ORDER — LIDOCAINE HCL (CARDIAC) 20 MG/ML IV SOLN
INTRAVENOUS | Status: DC | PRN
  Administered 2014-05-17: 50 mg via INTRAVENOUS

## 2014-05-17 MED ORDER — ONDANSETRON HCL 4 MG/2ML IJ SOLN
INTRAMUSCULAR | Status: AC
Start: 2014-05-17 — End: 2014-05-17
  Filled 2014-05-17: qty 2

## 2014-05-17 MED ORDER — LACTATED RINGERS IV SOLN
INTRAVENOUS | Status: DC
  Administered 2014-05-17 (×3): via INTRAVENOUS

## 2014-05-17 MED ORDER — DEXAMETHASONE SODIUM PHOSPHATE 10 MG/ML IJ SOLN
INTRAMUSCULAR | Status: DC | PRN
  Administered 2014-05-17: 4 mg via INTRAVENOUS

## 2014-05-17 MED ORDER — KETOROLAC TROMETHAMINE 30 MG/ML IJ SOLN
INTRAMUSCULAR | Status: AC
Start: 2014-05-17 — End: 2014-05-17
  Filled 2014-05-17: qty 1

## 2014-05-17 MED ORDER — BUPIVACAINE HCL (PF) 0.5 % IJ SOLN
INTRAMUSCULAR | Status: AC
Start: 2014-05-17 — End: 2014-05-17
  Filled 2014-05-17: qty 30

## 2014-05-17 MED ORDER — ROCURONIUM BROMIDE 100 MG/10ML IV SOLN
INTRAVENOUS | Status: AC
  Filled 2014-05-17: qty 1

## 2014-05-17 MED ORDER — ONDANSETRON HCL 4 MG/2ML IJ SOLN
INTRAMUSCULAR | Status: DC | PRN
  Administered 2014-05-17: 4 mg via INTRAVENOUS

## 2014-05-17 MED ORDER — MIDAZOLAM HCL 2 MG/2ML IJ SOLN
INTRAMUSCULAR | Status: AC
  Filled 2014-05-17: qty 2

## 2014-05-17 MED ORDER — KETOROLAC TROMETHAMINE 30 MG/ML IJ SOLN
INTRAMUSCULAR | Status: DC | PRN
  Administered 2014-05-17: 30 mg via INTRAVENOUS

## 2014-05-17 MED ORDER — METOCLOPRAMIDE HCL 5 MG/ML IJ SOLN: 10.0000 mg | Freq: Once | INTRAMUSCULAR | Status: DC | PRN

## 2014-05-17 MED ORDER — PROPOFOL 10 MG/ML IV BOLUS
INTRAVENOUS | Status: AC
  Filled 2014-05-17: qty 20

## 2014-05-17 MED ORDER — GLYCOPYRROLATE 0.2 MG/ML IJ SOLN
INTRAMUSCULAR | Status: DC | PRN
  Administered 2014-05-17: 0.4 mg via INTRAVENOUS

## 2014-05-17 MED ORDER — NEOSTIGMINE METHYLSULFATE 10 MG/10ML IV SOLN
INTRAVENOUS | Status: AC
  Filled 2014-05-17: qty 1

## 2014-05-17 SURGICAL SUPPLY — 26 items
APPLICATOR COTTON TIP 6IN STRL (MISCELLANEOUS) ×3 IMPLANT
CATH ROBINSON RED A/P 16FR (CATHETERS) ×3 IMPLANT
CLOSURE WOUND 1/4X4 (GAUZE/BANDAGES/DRESSINGS) ×1
CLOTH BEACON ORANGE TIMEOUT ST (SAFETY) ×3 IMPLANT
DRSG COVADERM PLUS 2X2 (GAUZE/BANDAGES/DRESSINGS) ×3 IMPLANT
DRSG OPSITE POSTOP 3X4 (GAUZE/BANDAGES/DRESSINGS) ×3 IMPLANT
DURAPREP 26ML APPLICATOR (WOUND CARE) ×3 IMPLANT
GLOVE BIO SURGEON STRL SZ 6.5 (GLOVE) ×2 IMPLANT
GLOVE BIO SURGEONS STRL SZ 6.5 (GLOVE) ×1
GOWN STRL REUS W/TWL LRG LVL3 (GOWN DISPOSABLE) ×6 IMPLANT
NDL SAFETY ECLIPSE 18X1.5 (NEEDLE) ×1 IMPLANT
NEEDLE HYPO 18GX1.5 SHARP (NEEDLE) ×2
NEEDLE INSUFFLATION 120MM (ENDOMECHANICALS) ×3 IMPLANT
NS IRRIG 1000ML POUR BTL (IV SOLUTION) ×3 IMPLANT
PACK LAPAROSCOPY BASIN (CUSTOM PROCEDURE TRAY) ×3 IMPLANT
PAD POSITIONER PINK NONSTERILE (MISCELLANEOUS) ×3 IMPLANT
PROTECTOR NERVE ULNAR (MISCELLANEOUS) ×3 IMPLANT
SHEARS HARMONIC ACE PLUS 36CM (ENDOMECHANICALS) ×3 IMPLANT
STRIP CLOSURE SKIN 1/4X4 (GAUZE/BANDAGES/DRESSINGS) ×2 IMPLANT
SUT VICRYL 0 UR6 27IN ABS (SUTURE) ×3 IMPLANT
SUT VICRYL 4-0 PS2 18IN ABS (SUTURE) ×6 IMPLANT
SYR 30ML LL (SYRINGE) ×3 IMPLANT
TOWEL OR 17X24 6PK STRL BLUE (TOWEL DISPOSABLE) ×6 IMPLANT
TROCAR OPTI TIP 5M 100M (ENDOMECHANICALS) ×6 IMPLANT
TROCAR XCEL DIL TIP R 11M (ENDOMECHANICALS) ×3 IMPLANT
WARMER LAPAROSCOPE (MISCELLANEOUS) ×3 IMPLANT

## 2014-05-17 NOTE — Discharge Instructions (Signed)
°  DISCHARGE INSTRUCTIONS: Laparoscopy  The following instructions have been prepared to help you care for yourself upon your return home today.  MAY REMOVE SCOP PATCH ON OR BEFORE 05/20/2014.  MAY TAKE IBUPROFEN PRODUCTS AFTER 8:15pm as needed for pain.  USE A STOOL SOFTNER WHILE TAKE NARCOTICS.  DRINK PLENTY OF WATER.  Wound care:  Do not get the incision wet for the first 24 hours. The incision should be kept clean and dry.  The Band-Aids or dressings may be removed the day after surgery.  Should the incision become sore, red, and swollen after the first week, check with your doctor.  Personal hygiene:  Shower the day after your procedure.  Activity and limitations:  Do NOT drive or operate any equipment today.  Do NOT lift anything more than 15 pounds for 2-3 weeks after surgery.  Do NOT rest in bed all day.  Walking is encouraged. Walk each day, starting slowly with 5-minute walks 3 or 4 times a day. Slowly increase the length of your walks.  Walk up and down stairs slowly.  Do NOT do strenuous activities, such as golfing, playing tennis, bowling, running, biking, weight lifting, gardening, mowing, or vacuuming for 2-4 weeks. Ask your doctor when it is okay to start.  DO NOT HAVE UNPROTECTED SEX UNTIL AFTER YOUR NEXT MENSTRUAL PERIOD  Diet: Eat a light meal as desired this evening. You may resume your usual diet tomorrow.  Return to work: This is dependent on the type of work you do. For the most part you can return to a desk job within a week of surgery. If you are more active at work, please discuss this with your doctor.  What to expect after your surgery: You may have a slight burning sensation when you urinate on the first day. You may have a very small amount of blood in the urine. Expect to have a small amount of vaginal discharge/light bleeding for 1-2 weeks. It is not unusual to have abdominal soreness and bruising for up to 2 weeks. You may be tired and need  more rest for about 1 week. You may experience shoulder pain for 24-72 hours. Lying flat in bed may relieve it.  Call your doctor for any of the following:  Develop a fever of 100.4 or greater  Inability to urinate 6 hours after discharge from hospital  Severe pain not relieved by pain medications  Persistent of heavy bleeding at incision site  Redness or swelling around incision site after a week  Increasing nausea or vomiting

## 2014-05-17 NOTE — Op Note (Signed)
05/17/2014  2:16 PM  PATIENT:  Robin Erickson  29 y.o. female  PRE-OPERATIVE DIAGNOSIS:  Undesired Fertility  POST-OPERATIVE DIAGNOSIS:  Undesired Fertility  PROCEDURE:  Procedure(s): LAPAROSCOPIC BILATERAL SALPINGECTOMY (Bilateral)  SURGEON:  Surgeon(s) and Role:  Allie BossierMyra C Keshawn Sundberg, MD - Primary   ANESTHESIA:   general  EBL:  Total I/O In: 600 [I.V.:600] Out: 75 [Urine:50; Blood:25]  BLOOD ADMINISTERED:none  DRAINS: none   LOCAL MEDICATIONS USED:  MARCAINE     SPECIMEN:  Source of Specimen:  bilateral oviducts  DISPOSITION OF SPECIMEN:  PATHOLOGY  COUNTS:  YES  TOURNIQUET:  * No tourniquets in log *  DICTATION: .Dragon Dictation  PLAN OF CARE: Discharge to home after PACU  PATIENT DISPOSITION:  PACU - hemodynamically stable.   Delay start of Pharmacological VTE agent (>24hrs) due to surgical blood loss or risk of bleeding: not applicable    The risks, benefits, and alternatives of surgery were explained, understood, accepted. She is certain that she wants permanent sterility. She understands that this is not reversible. She understands there is a small failure rate of this procedure. She also would like to have both oviducts removed her due newest recommendations to help prevent ovarian/peritoneal cancer. In the operating room she was placed in the dorsal lithotomy position, and general anesthesia was given without complication. Her abdomen and vagina were prepped and draped in the usual sterile fashion. A timeout procedure was done. A bimanual exam revealed a small anteverted and mobile uterus. Her adnexa felt normal. A Hulka manipulator was placed. Her bladder was emptied with a Robinson catheter. Gloves were changed, and attention was turned to the abdomen. Approximately the 10 mL of 0.5% Marcaine was injected into the umbilicus. A vertical incision was made at the site. A varies needle was placed intraperitoneally. Low-flow CO2 was used to insufflate the abdomen to  approximately 3 L. Once a good pneumoperitoneum was established, a 10 mm Excel trocar was placed. Laparoscopy confirmed correct placement. A 5 mm port was placed in each lower quadrant under direct laparoscopic visualization after injecting 0.5% Marcaine in the incision sites. Her pelvis and upper abdomen appeared normal. A Harmonic scapel was used to hemostatically removed both oviducts. I switched to a 5 mm camera and removed the oviducts through the umbilical port.  I removed the 5 mm ports and noted hemostasis. The umbilical fascia was closed with a 0 vicryl suture. No defects were palpable. A subcuticular closure was done with 4-0 Vicryl suture at all incision sites. A Steri-Strip was placed across each incision. She was extubated and taken to the recovery room in stable condition.

## 2014-05-17 NOTE — Anesthesia Preprocedure Evaluation (Signed)
Anesthesia Evaluation  Patient identified by MRN, date of birth, ID band Patient awake    Reviewed: Allergy & Precautions, NPO status , Patient's Chart, lab work & pertinent test results  Airway Mallampati: II  TM Distance: >3 FB Neck ROM: Full    Dental no notable dental hx. (+) Teeth Intact   Pulmonary neg pulmonary ROS,  breath sounds clear to auscultation  Pulmonary exam normal       Cardiovascular hypertension, + Valvular Problems/Murmurs Rhythm:Regular Rate:Normal + Systolic murmurs Currently on no Rx Hx/o PIH   Neuro/Psych  Headaches, Anxiety    GI/Hepatic negative GI ROS, Neg liver ROS,   Endo/Other  Obesity  Renal/GU negative Renal ROS  negative genitourinary   Musculoskeletal negative musculoskeletal ROS (+)   Abdominal (+) + obese,   Peds  Hematology  (+) anemia ,   Anesthesia Other Findings   Reproductive/Obstetrics Desires sterilization                             Anesthesia Physical Anesthesia Plan  ASA: II  Anesthesia Plan: General   Post-op Pain Management:    Induction: Intravenous  Airway Management Planned: Oral ETT  Additional Equipment:   Intra-op Plan:   Post-operative Plan:   Informed Consent: I have reviewed the patients History and Physical, chart, labs and discussed the procedure including the risks, benefits and alternatives for the proposed anesthesia with the patient or authorized representative who has indicated his/her understanding and acceptance.     Plan Discussed with: CRNA, Anesthesiologist and Surgeon  Anesthesia Plan Comments:         Anesthesia Quick Evaluation

## 2014-05-17 NOTE — Anesthesia Postprocedure Evaluation (Signed)
Anesthesia Post Note  Patient: Robin ScotlandRakesha Erickson  Procedure(s) Performed: Procedure(s) (LRB): LAPAROSCOPIC BILATERAL SALPINGECTOMY (Bilateral)  Anesthesia type: General  Patient location: PACU  Post pain: Pain level controlled  Post assessment: Post-op Vital signs reviewed  Last Vitals:  Filed Vitals:   05/17/14 1515  BP: 137/87  Pulse: 77  Temp:   Resp: 16    Post vital signs: Reviewed  Level of consciousness: sedated  Complications: No apparent anesthesia complications

## 2014-05-17 NOTE — H&P (Signed)
Toma AranRakesha Sherrine MaplesGlenn is a 29 y.o. SAA P3 (9 month, 8 and 30 yo children) who is here today to have permanent sterility in the form of laparoscopic bilateral salpingectomy. She understands that this is definitely not reversible. She has been on depo provera.     No LMP recorded. Patient has had an injection.    Past Medical History  Diagnosis Date  . Hypertension   . Abnormal Pap smear   . Heart murmur     infant, no complications  . Anxiety   . Headache     migraines    Past Surgical History  Procedure Laterality Date  . Wisdom tooth extraction    . Eye surgery      age 89    Family History  Problem Relation Age of Onset  . Hypertension Father   . Diabetes Maternal Aunt   . Hypertension Mother   . Glaucoma Maternal Grandmother     Social History:  reports that she has never smoked. She has never used smokeless tobacco. She reports that she does not drink alcohol or use illicit drugs.  Allergies: No Known Allergies  Facility-administered medications prior to admission  Medication Dose Route Frequency Provider Last Rate Last Dose  . medroxyPROGESTERone (DEPO-PROVERA) injection 150 mg  150 mg Intramuscular Q90 days Allie BossierMyra C Sheily Lineman, MD   150 mg at 09/29/13 0944   Prescriptions prior to admission  Medication Sig Dispense Refill Last Dose  . aspirin-acetaminophen-caffeine (EXCEDRIN MIGRAINE) 250-250-65 MG per tablet Take 2 tablets by mouth every 6 (six) hours as needed for headache.   Past Week at Unknown time  . hydrochlorothiazide (HYDRODIURIL) 25 MG tablet Take 1 tablet (25 mg total) by mouth daily. (Patient not taking: Reported on 05/04/2014) 30 tablet 3 Taking  . ibuprofen (ADVIL,MOTRIN) 600 MG tablet Take 1 tablet (600 mg total) by mouth every 6 (six) hours. (Patient not taking: Reported on 05/04/2014) 30 tablet 0 Taking    ROS  She has irregular bleeding with depo provera. She has had a flu vaccine.   Blood pressure 149/101, pulse 94, temperature 99.1 F (37.3 C), temperature  source Oral, resp. rate 20, SpO2 100 %, not currently breastfeeding. Physical Exam  Heart- rrr Lungs- CTAB Abd- benign  Results for orders placed or performed during the hospital encounter of 05/17/14 (from the past 24 hour(s))  Pregnancy, urine     Status: None   Collection Time: 05/17/14 12:00 PM  Result Value Ref Range   Preg Test, Ur NEGATIVE NEGATIVE    No results found.  Assessment/Plan: Multiparity, desires sterility.  She understands the risks of surgery, including, but not to infection, bleeding, DVTs, damage to bowel, bladder, ureters. She wishes to proceed.     Quetzal Meany C. 05/17/2014, 12:49 PM

## 2014-05-17 NOTE — Transfer of Care (Signed)
Immediate Anesthesia Transfer of Care Note  Patient: Robin ScotlandRakesha Lansdale  Procedure(s) Performed: Procedure(s): LAPAROSCOPIC BILATERAL SALPINGECTOMY (Bilateral)  Patient Location: PACU  Anesthesia Type:General  Level of Consciousness: awake  Airway & Oxygen Therapy: Patient Spontanous Breathing  Post-op Assessment: Report given to PACU RN  Post vital signs: stable  Filed Vitals:   05/17/14 1208  BP: 149/101  Pulse: 94  Temp: 37.3 C  Resp: 20    Complications: No apparent anesthesia complications

## 2014-05-18 ENCOUNTER — Encounter (HOSPITAL_COMMUNITY): Payer: Self-pay | Admitting: Obstetrics & Gynecology

## 2015-03-02 IMAGING — US US OB LIMITED
1 series · 13 of 22 positions shown · non-contrast
Comparison: none

[Series 1: us ob limited · 0.23mm/px · 22 acquisitions, 13 frames shown]
[im 1/22]
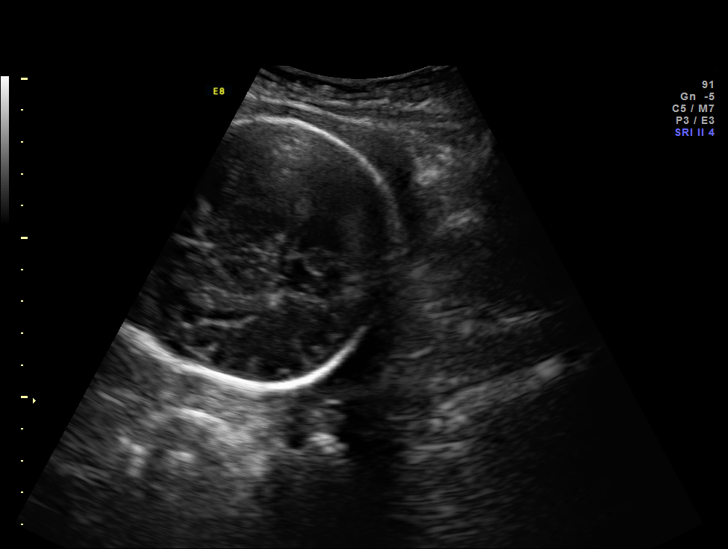
[im 3/22]
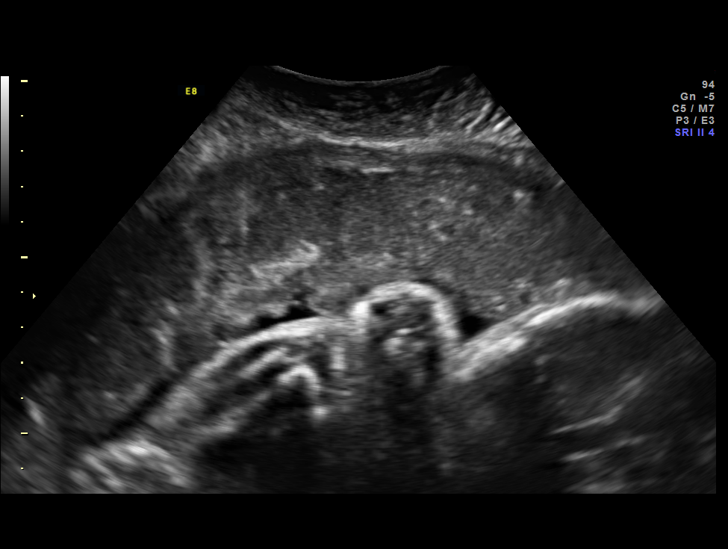
[im 5/22]
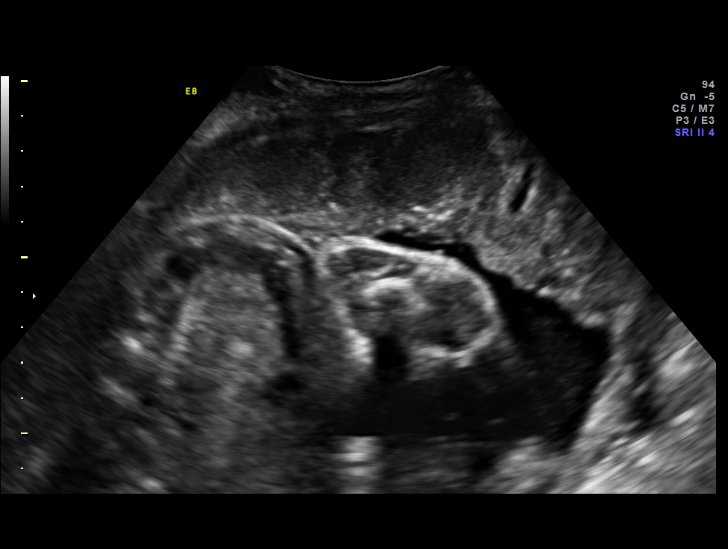
[im 6/22]
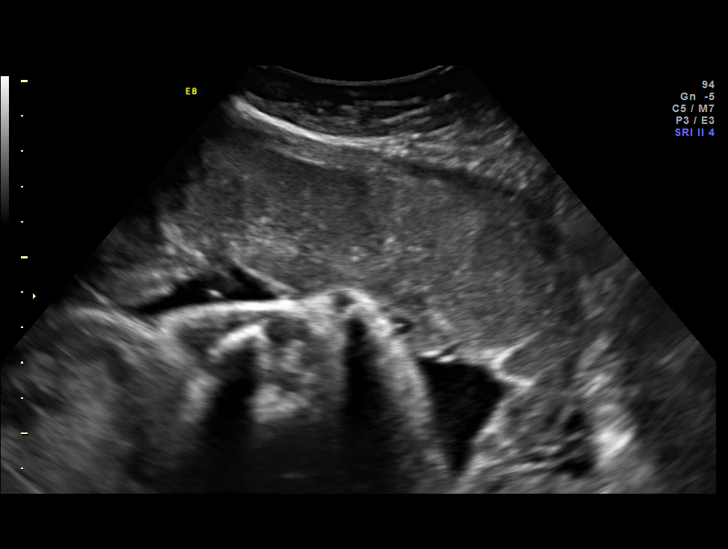
[im 8/22]
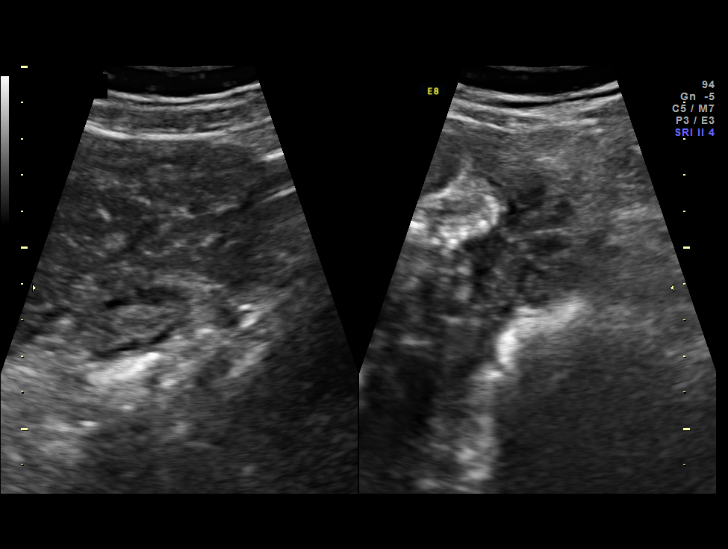
[im 10/22]
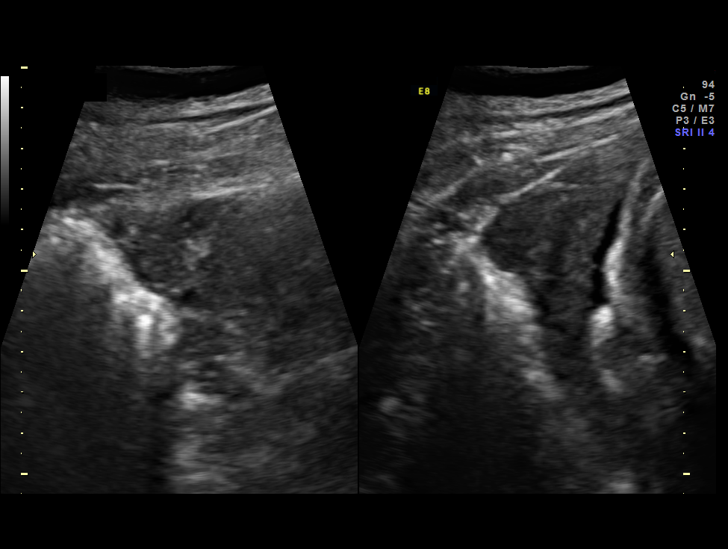
[im 12/22]
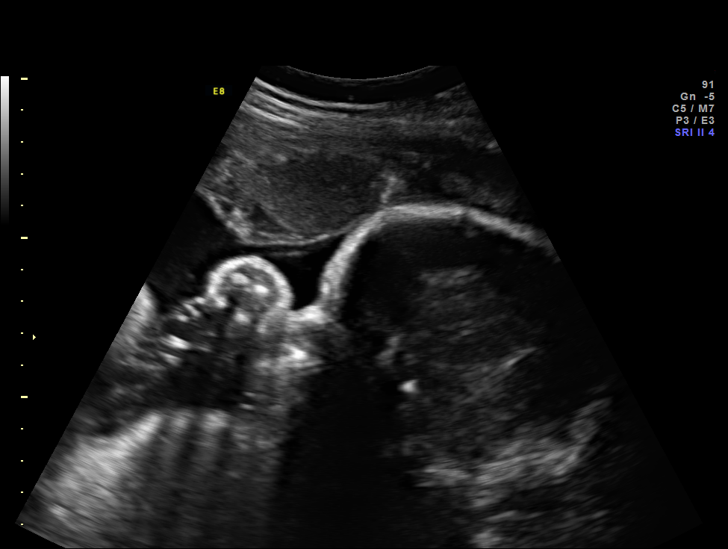
[im 13/22]
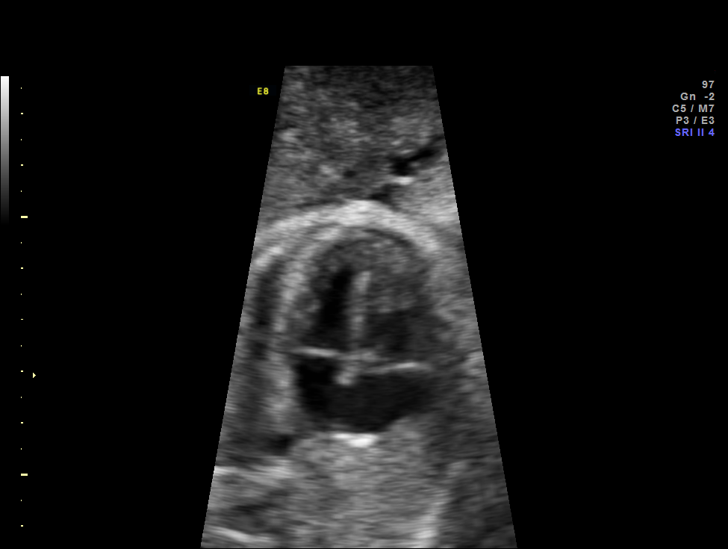
[im 15/22]
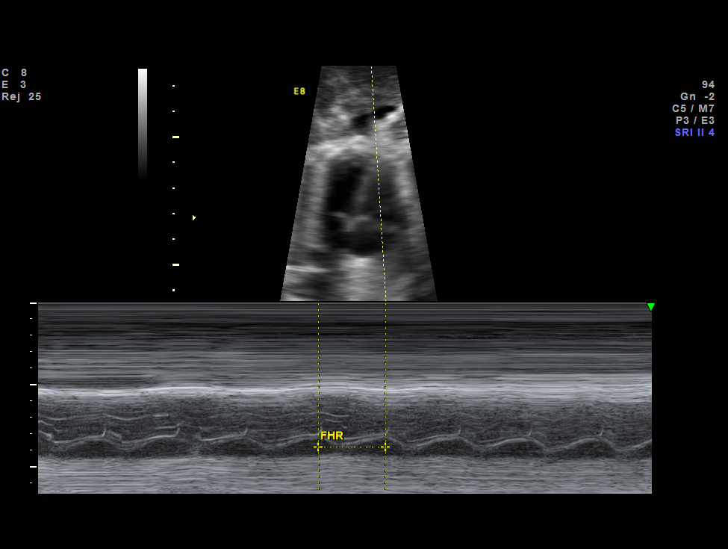
[im 17/22]
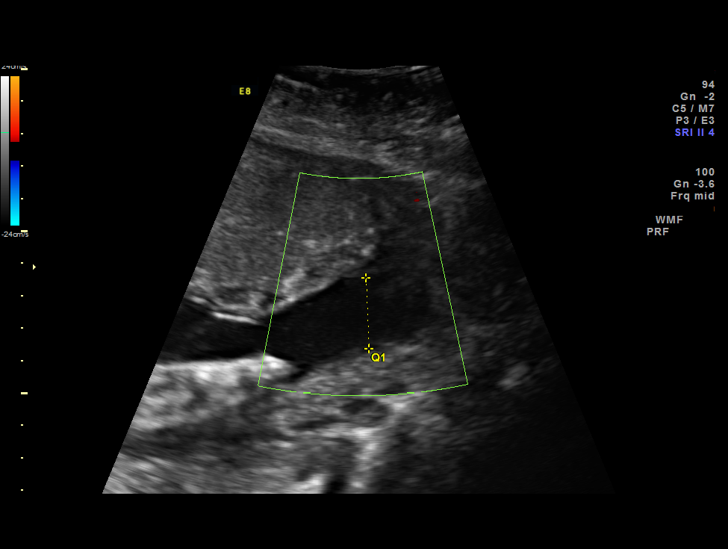
[im 18/22]
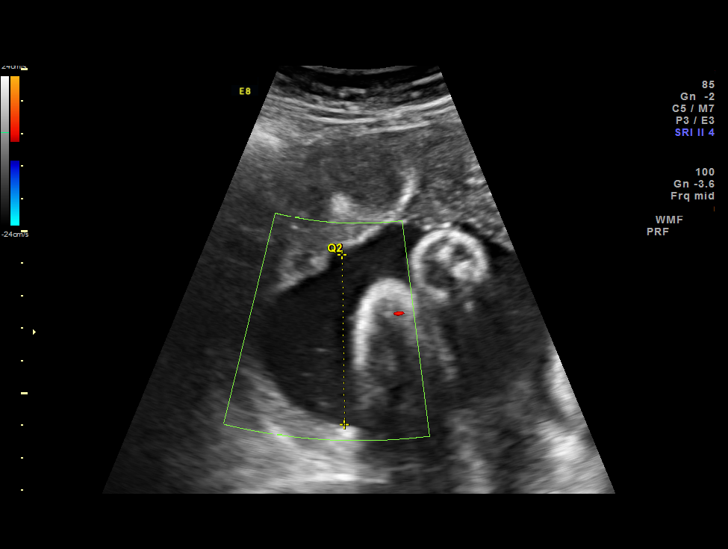
[im 20/22]
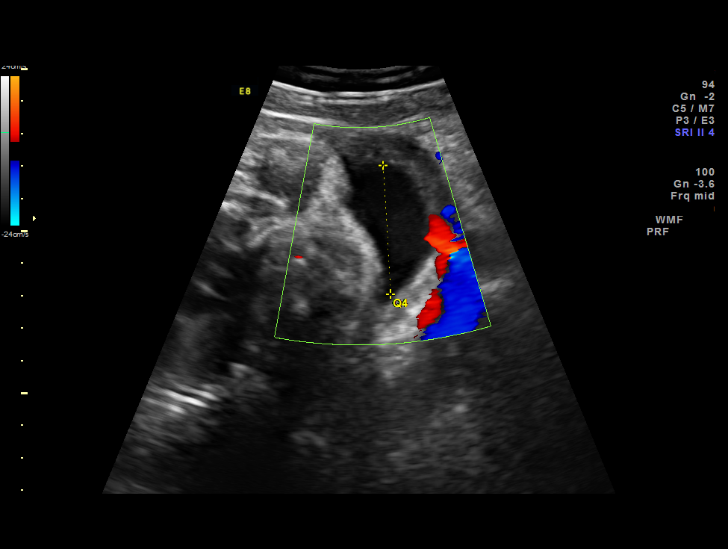
[im 22/22]
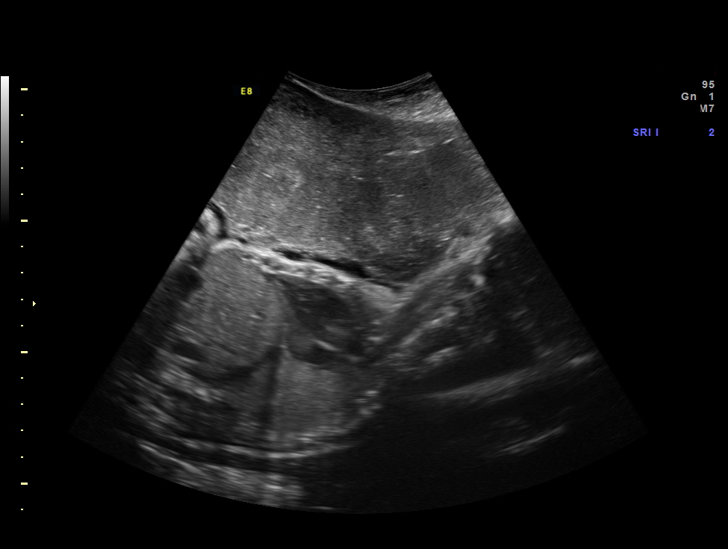

[13 of 22 positions shown; findings below may reference images not displayed]

OBSTETRICS REPORT
                      (Signed Final 05/23/2013 [DATE])

Service(s) Provided

 [HOSPITAL]                                         76815.0
Indications

 Hypertension - Chronic/Pre-existing - labelatol
Fetal Evaluation

 Num Of Fetuses:    1
 Fetal Heart Rate:  150                          bpm
 Cardiac Activity:  Observed
 Presentation:      Cephalic
 Placenta:          Anterior, above cervical os
 P. Cord            Previously Visualized
 Insertion:

 Amniotic Fluid
 AFI FV:      Subjectively within normal limits
 AFI Sum:     14.99   cm       53  %Tile     Larg Pckt:    5.24  cm
 RUQ:   2.19    cm   RLQ:    3.99   cm    LUQ:   5.24    cm   LLQ:    3.57   cm
Gestational Age

 LMP:           33w 2d        Date:  10/02/12                 EDD:   07/09/13
 Best:          33w 2d     Det. By:  LMP  (10/02/12)          EDD:   07/09/13
Cervix Uterus Adnexa

 Cervix:       Not visualized (advanced GA >82wks)
 Uterus:       No abnormality visualized.
 Cul De Sac:   No free fluid seen.

 Left Ovary:    Within normal limits.
 Right Ovary:   Within normal limits.
 Adnexa:     No abnormality visualized.
Impression

 Single IUP at 33 [DATE] weeks
 CHTN on Labetalol 300 mg TID
 Limited ultrasound performed for amniotic fluid assessment
 AFI 14.9 cm
 Reactive NST
 Normal modified BPP

 BPs: 151/103; repeat 146/97 (somewhat improved from
 previous evaluations, normal preeclampsia labs [DATE])
Recommendations

 Continue 2x weekly NSTs with weekly AFIs
 Follow up growth scan in 3 weeks.
 Continue close follow up due to elevated Blood pressures-
 may need to increase dose of Labetalol.

 questions or concerns.

## 2015-04-03 ENCOUNTER — Encounter (HOSPITAL_COMMUNITY): Payer: Self-pay | Admitting: *Deleted

## 2018-01-17 ENCOUNTER — Emergency Department (HOSPITAL_COMMUNITY)
Admission: EM | Admit: 2018-01-17 | Discharge: 2018-01-17 | Disposition: A | Discharge status: Home / Self Care | Payer: Self-pay | Attending: Emergency Medicine | Admitting: Emergency Medicine

## 2018-01-17 ENCOUNTER — Encounter (HOSPITAL_COMMUNITY): Payer: Self-pay | Admitting: Emergency Medicine

## 2018-01-17 ENCOUNTER — Other Ambulatory Visit: Payer: Self-pay

## 2018-01-17 DIAGNOSIS — I10 Essential (primary) hypertension: Secondary | ICD-10-CM

## 2018-01-17 DIAGNOSIS — R519 Headache, unspecified: Secondary | ICD-10-CM

## 2018-01-17 DIAGNOSIS — R51 Headache: Secondary | ICD-10-CM

## 2018-01-17 LAB — CBC WITH DIFFERENTIAL/PLATELET
Abs Immature Granulocytes: 0.02 10*3/uL (ref 0.00–0.07)
BASOS PCT: 1 %
Basophils Absolute: 0 10*3/uL (ref 0.0–0.1)
EOS PCT: 4 %
Eosinophils Absolute: 0.3 10*3/uL (ref 0.0–0.5)
HCT: 30.4 % — ABNORMAL LOW (ref 36.0–46.0)
Hemoglobin: 8.8 g/dL — ABNORMAL LOW (ref 12.0–15.0)
Immature Granulocytes: 0 %
Lymphocytes Relative: 24 %
Lymphs Abs: 2.1 10*3/uL (ref 0.7–4.0)
MCH: 24.2 pg — AB (ref 26.0–34.0)
MCHC: 28.9 g/dL — AB (ref 30.0–36.0)
MCV: 83.5 fL (ref 80.0–100.0)
MONO ABS: 0.6 10*3/uL (ref 0.1–1.0)
MONOS PCT: 7 %
Neutro Abs: 5.6 10*3/uL (ref 1.7–7.7)
Neutrophils Relative %: 64 %
PLATELETS: 578 10*3/uL — AB (ref 150–400)
RBC: 3.64 MIL/uL — ABNORMAL LOW (ref 3.87–5.11)
RDW: 15.5 % (ref 11.5–15.5)
WBC: 8.6 10*3/uL (ref 4.0–10.5)
nRBC: 0 % (ref 0.0–0.2)

## 2018-01-17 LAB — BASIC METABOLIC PANEL
Anion gap: 7 (ref 5–15)
BUN: 10 mg/dL (ref 6–20)
CO2: 23 mmol/L (ref 22–32)
Calcium: 8.8 mg/dL — ABNORMAL LOW (ref 8.9–10.3)
Chloride: 106 mmol/L (ref 98–111)
Creatinine, Ser: 0.69 mg/dL (ref 0.44–1.00)
GFR calc non Af Amer: >60 mL/min (ref >60)
GLUCOSE: 93 mg/dL (ref 70–99)
Potassium: 3.6 mmol/L (ref 3.5–5.1)
Sodium: 136 mmol/L (ref 135–145)

## 2018-01-17 MED ORDER — DEXAMETHASONE 4 MG PO TABS
10.0000 mg | ORAL_TABLET | Freq: Once | ORAL | Status: AC
  Administered 2018-01-17: 10 mg via ORAL
  Filled 2018-01-17: qty 3

## 2018-01-17 MED ORDER — HYDROCHLOROTHIAZIDE 25 MG PO TABS: 25.0000 mg | ORAL_TABLET | Freq: Every day | ORAL | 0 refills | Status: DC

## 2018-01-17 MED ORDER — DIPHENHYDRAMINE HCL 50 MG/ML IJ SOLN
25.0000 mg | Freq: Once | INTRAMUSCULAR | Status: AC
  Administered 2018-01-17: 25 mg via INTRAMUSCULAR
  Filled 2018-01-17: qty 1

## 2018-01-17 MED ORDER — PROCHLORPERAZINE EDISYLATE 10 MG/2ML IJ SOLN
10.0000 mg | Freq: Once | INTRAMUSCULAR | Status: AC
  Administered 2018-01-17: 10 mg via INTRAMUSCULAR
  Filled 2018-01-17: qty 2

## 2018-01-17 NOTE — Discharge Instructions (Signed)
Follow up with a neurologist.  Return for worsening headache, fever, neck pain, weakness or numbness to your arms or legs. Difficulty with speech or swallowing.

## 2018-01-17 NOTE — ED Provider Notes (Signed)
[facility] EMERGENCY DEPARTMENT Provider Note   CSN: [id number] Arrival date & time: 01/17/18  1248     History   Chief Complaint Chief Complaint  Patient presents with  . Headache  . Hypertension    HPI Robin Erickson is a 32 y.o. female.  32 yo F with a chief complaint of a headache.  Going on for the past 3 days.  Patient has a history of headaches and thinks this feels the same.  Worse than it normally is.  Right-sided sometimes with a throbbing-like quality.  Not sure what makes it better or worse.  She denies fevers or chills denies trauma denies unilateral numbness or weakness denies difficulty with speech or swallowing.  The history is provided by the patient.  Headache   This is a recurrent problem. The current episode started more than 2 days ago. The problem occurs constantly. The problem has been gradually worsening. The headache is associated with nothing. The pain is located in the right unilateral region. The quality of the pain is described as sharp, dull and throbbing. The pain is at a severity of 8/10. The pain is moderate. The pain does not radiate. Pertinent negatives include no fever, no palpitations, no shortness of breath, no nausea and no vomiting. She has tried nothing for the symptoms. The treatment provided no relief.  Hypertension  Associated symptoms include headaches. Pertinent negatives include no chest pain and no shortness of breath.    Past Medical History:  Diagnosis Date  . Abnormal Pap smear   . Anxiety   . Headache    migraines  . Heart murmur    infant, no complications  . Hypertension     Patient Active Problem List   Diagnosis Date Noted  . Mastitis, obstetric, delivered with postpartum condition 06/02/2013  . Benign essential hypertension with delivery 06/02/2013  . Hypertension in pregnancy, preeclampsia, severe, antepartum 05/27/2013  . Migraine without aura 12/17/2012  . Chronic hypertension in pregnancy  12/17/2012  . Supervision of other high-risk pregnancy 12/17/2012    Past Surgical History:  Procedure Laterality Date  . EYE SURGERY     age 84  . LAPAROSCOPIC BILATERAL SALPINGECTOMY Bilateral 05/17/2014   Procedure: LAPAROSCOPIC BILATERAL SALPINGECTOMY;  Surgeon: Allie Bossier, MD;  Location: [facility];  Service: Gynecology;  Laterality: Bilateral;  . WISDOM TOOTH EXTRACTION       OB History    Gravida  3   Para  3   Term  2   Preterm  1   AB      Living  3     SAB      TAB      Ectopic      Multiple      Live Births  3            Home Medications    Prior to Admission medications   Medication Sig Start Date End Date Taking? Authorizing Provider  aspirin-acetaminophen-caffeine (EXCEDRIN MIGRAINE) [phone] MG per tablet Take 2 tablets by mouth every 6 (six) hours as needed for headache or migraine.    Yes [provider]  hydrochlorothiazide (HYDRODIURIL) 25 MG tablet Take 1 tablet (25 mg total) by mouth daily. 01/17/18   Melene Plan, DO  ibuprofen (ADVIL,MOTRIN) 600 MG tablet Take 1 tablet (600 mg total) by mouth every 6 (six) hours. Patient not taking: Reported on 05/04/2014 05/30/13   Amedeo Gory, CNM  oxyCODONE-acetaminophen (PERCOCET/ROXICET) 5-325 MG per tablet Take 1-2 tablets by  mouth every 6 (six) hours as needed. Patient not taking: Reported on 01/17/2018 05/17/14   Adam Phenix, MD    Family History Family History  Problem Relation Age of Onset  . Hypertension Mother   . Hypertension Father   . Diabetes Maternal Aunt   . Glaucoma Maternal Grandmother     Social History Social History   Tobacco Use  . Smoking status: Never Smoker  . Smokeless tobacco: Never Used  Substance Use Topics  . Alcohol use: No  . Drug use: No     Allergies   Patient has no known allergies.   Review of Systems Review of Systems  Constitutional: Negative for chills and fever.  HENT: Negative for congestion and rhinorrhea.   Eyes:  Negative for redness and visual disturbance.  Respiratory: Negative for shortness of breath and wheezing.   Cardiovascular: Negative for chest pain and palpitations.  Gastrointestinal: Negative for nausea and vomiting.  Genitourinary: Negative for dysuria and urgency.  Musculoskeletal: Negative for arthralgias and myalgias.  Skin: Negative for pallor and wound.  Neurological: Positive for headaches. Negative for dizziness.     Physical Exam Updated Vital Signs BP (!) 140/110 (BP Location: Right Arm)   Pulse 77   Temp 98.9 F (37.2 C) (Oral)   Resp 16   Ht 5\' 3"  (1.6 m)   Wt 59 kg   SpO2 100%   BMI 23.03 kg/m   Physical Exam  Constitutional: She is oriented to person, place, and time. She appears well-developed and well-nourished. No distress.  HENT:  Head: Normocephalic and atraumatic.  Eyes: Pupils are equal, round, and reactive to light. EOM are normal.  Neck: Normal range of motion. Neck supple.  Cardiovascular: Normal rate and regular rhythm. Exam reveals no gallop and no friction rub.  No murmur heard. Pulmonary/Chest: Effort normal. She has no wheezes. She has no rales.  Abdominal: Soft. She exhibits no distension. There is no tenderness.  Musculoskeletal: She exhibits no edema or tenderness.  Neurological: She is alert and oriented to person, place, and time. She has normal strength. No cranial nerve deficit or sensory deficit. She displays a negative Romberg sign. Coordination and gait normal. GCS eye subscore is 4. GCS verbal subscore is 5. GCS motor subscore is 6.  Benign neurologic exam ambulates without difficulty  Skin: Skin is warm and dry. She is not diaphoretic.  Psychiatric: She has a normal mood and affect. Her behavior is normal.  Nursing note and vitals reviewed.    ED Treatments / Results  Labs (all labs ordered are listed, but only abnormal results are displayed) Labs Reviewed  CBC WITH DIFFERENTIAL/PLATELET - Abnormal; Notable for the following  components:      Result Value   RBC 3.64 (*)    Hemoglobin 8.8 (*)    HCT 30.4 (*)    MCH 24.2 (*)    MCHC 28.9 (*)    Platelets 578 (*)    All other components within normal limits  BASIC METABOLIC PANEL - Abnormal; Notable for the following components:   Calcium 8.8 (*)    All other components within normal limits    EKG None  Radiology No results found.  Procedures Procedures (including critical care time)  Medications Ordered in ED Medications  prochlorperazine (COMPAZINE) injection 10 mg (10 mg Intramuscular Given 01/17/18 1555)  diphenhydrAMINE (BENADRYL) injection 25 mg (25 mg Intramuscular Given 01/17/18 1556)  dexamethasone (DECADRON) tablet 10 mg (10 mg Oral Given 01/17/18 1545)     Initial  Impression / Assessment and Plan / ED Course  I have reviewed the triage vital signs and the nursing notes.  Pertinent labs & imaging results that were available during my care of the patient were reviewed by me and considered in my medical decision making (see chart for details).     32 yo F with a chief complaint of a headache.  Feels like her prior.  No red flags.  Benign neuro exam.  Will give a headache cocktail and reassess likely discharge home.  Signed out to Dr. Lockie Mola, please see his note for further details of care in the ED.   The patients results and plan were reviewed and discussed.   Any x-rays performed were independently reviewed by myself.   Differential diagnosis were considered with the presenting HPI.  Medications  prochlorperazine (COMPAZINE) injection 10 mg (10 mg Intramuscular Given 01/17/18 1555)  diphenhydrAMINE (BENADRYL) injection 25 mg (25 mg Intramuscular Given 01/17/18 1556)  dexamethasone (DECADRON) tablet 10 mg (10 mg Oral Given 01/17/18 1545)    Vitals:   01/17/18 1256 01/17/18 1257 01/17/18 1415 01/17/18 1430  BP: (!) 160/103  (!) 140/110 (!) 140/110  Pulse: 84  68 77  Resp: 17   16  Temp: 99.1 F (37.3 C)   98.9 F (37.2 C)    TempSrc: Oral   Oral  SpO2: 100%  100% 100%  Weight:  59 kg    Height:  5\' 3"  (1.6 m)      Final diagnoses:  Bad headache  Essential hypertension       Final Clinical Impressions(s) / ED Diagnoses   Final diagnoses:  Bad headache  Essential hypertension    ED Discharge Orders         Ordered    hydrochlorothiazide (HYDRODIURIL) 25 MG tablet  Daily     01/17/18 1604    Ambulatory referral to Neurology    Comments:  Headache syndrome   01/17/18 1604           Melene Plan, DO 01/17/18 1607

## 2018-01-17 NOTE — ED Provider Notes (Signed)
Assumed care from Dr. Adela Lank at 4 PM.  Patient with migraine headache.  Given headache cocktail reevaluate.  Lab work unremarkable.  No concern for intracranial process.  Patient feels improved following headache cocktail and was discharged in ED in good condition.   Virgina Norfolk, DO 01/17/18 1806

## 2018-01-17 NOTE — ED Triage Notes (Signed)
Pt. Stated, Robin Erickson had a headache for 3 days and I have high BP and Ive been out of my med for a year. Waiting on my BP med.

## 2018-11-23 ENCOUNTER — Emergency Department (HOSPITAL_COMMUNITY): Payer: Medicaid Other

## 2018-11-23 ENCOUNTER — Encounter (HOSPITAL_COMMUNITY): Payer: Self-pay | Admitting: *Deleted

## 2018-11-23 ENCOUNTER — Other Ambulatory Visit: Payer: Self-pay

## 2018-11-23 ENCOUNTER — Emergency Department (HOSPITAL_COMMUNITY)
Admission: EM | Admit: 2018-11-23 | Discharge: 2018-11-23 | Disposition: A | Discharge status: Home / Self Care | Payer: Medicaid Other | Attending: Emergency Medicine | Admitting: Emergency Medicine

## 2018-11-23 DIAGNOSIS — R1013 Epigastric pain: Secondary | ICD-10-CM | POA: Diagnosis present

## 2018-11-23 DIAGNOSIS — I10 Essential (primary) hypertension: Secondary | ICD-10-CM | POA: Diagnosis not present

## 2018-11-23 DIAGNOSIS — K219 Gastro-esophageal reflux disease without esophagitis: Secondary | ICD-10-CM | POA: Diagnosis not present

## 2018-11-23 DIAGNOSIS — D509 Iron deficiency anemia, unspecified: Secondary | ICD-10-CM | POA: Diagnosis not present

## 2018-11-23 DIAGNOSIS — R195 Other fecal abnormalities: Secondary | ICD-10-CM | POA: Diagnosis not present

## 2018-11-23 LAB — BASIC METABOLIC PANEL
Anion gap: 13 (ref 5–15)
BUN: 9 mg/dL (ref 6–20)
CO2: 21 mmol/L — ABNORMAL LOW (ref 22–32)
Calcium: 9.1 mg/dL (ref 8.9–10.3)
Chloride: 103 mmol/L (ref 98–111)
Creatinine, Ser: 0.8 mg/dL (ref 0.44–1.00)
GFR calc Af Amer: >60 mL/min (ref >60)
GFR calc non Af Amer: >60 mL/min (ref >60)
Glucose, Bld: 92 mg/dL (ref 70–99)
Potassium: 3.6 mmol/L (ref 3.5–5.1)
Sodium: 137 mmol/L (ref 135–145)

## 2018-11-23 LAB — I-STAT BETA HCG BLOOD, ED (MC, WL, AP ONLY): I-stat hCG, quantitative: <5 m[IU]/mL (ref <5)

## 2018-11-23 LAB — CBC
HCT: 27.9 % — ABNORMAL LOW (ref 36.0–46.0)
Hemoglobin: 7.8 g/dL — ABNORMAL LOW (ref 12.0–15.0)
MCH: 21.3 pg — ABNORMAL LOW (ref 26.0–34.0)
MCHC: 28 g/dL — ABNORMAL LOW (ref 30.0–36.0)
MCV: 76.2 fL — ABNORMAL LOW (ref 80.0–100.0)
Platelets: 627 10*3/uL — ABNORMAL HIGH (ref 150–400)
RBC: 3.66 MIL/uL — ABNORMAL LOW (ref 3.87–5.11)
RDW: 17.2 % — ABNORMAL HIGH (ref 11.5–15.5)
WBC: 10.2 10*3/uL (ref 4.0–10.5)
nRBC: 0 % (ref 0.0–0.2)

## 2018-11-23 LAB — POC OCCULT BLOOD, ED: Fecal Occult Bld: NEGATIVE

## 2018-11-23 MED ORDER — OMEPRAZOLE 40 MG PO CPDR: 40.0000 mg | DELAYED_RELEASE_CAPSULE | Freq: Every day | ORAL | 2 refills | Status: AC

## 2018-11-23 MED ORDER — FERROUS SULFATE 325 (65 FE) MG PO TABS: 325.0000 mg | ORAL_TABLET | Freq: Every day | ORAL | 0 refills | Status: AC

## 2018-11-23 MED ORDER — SUCRALFATE 1 G PO TABS: 1.0000 g | ORAL_TABLET | Freq: Three times a day (TID) | ORAL | 0 refills | Status: AC

## 2018-11-23 MED ORDER — SODIUM CHLORIDE 0.9% FLUSH: 3.0000 mL | Freq: Once | INTRAVENOUS | Status: DC

## 2018-11-23 NOTE — ED Triage Notes (Signed)
To ED for eval of epigastric pain/cp. States she has had this pain in the past and told it was due to her migraine medicine - excedrin. States she taking Excedrin the last 3 days for a migraine. Went back to work today and while there felt hot and like she was going to pass out. Pt works at [employer] in the [employer]. Intermittent nausea with this pain - since this summer per pt. Appears in nad.

## 2018-11-23 NOTE — Discharge Instructions (Addendum)
Please take medications as prescribed.  Reviewed package inserts.  Review iron deficiency anemia attachment.  Increase fluid hydration and encourage high-fiber diet given constipating nature of iron supplementation.

## 2018-11-23 NOTE — ED Provider Notes (Addendum)
[facility] EMERGENCY DEPARTMENT Provider Note   CSN: [id number] Arrival date & time: 11/23/18  1538     History   Chief Complaint Chief Complaint  Patient presents with   Abdominal Pain   Chest Pain    HPI Robin Erickson is a 33 y.o. female with past medical history significant for migraines who presents to the ED after feeling like she was going to pass out at work.  She states that she has been feeling fatigued for 2 months, but that is gotten worse this past week.  She also complains of burning epigastric pain that radiates towards her throat that has also persisted for approximately 2 months.  She reports experiencing this burning epigastric pain on a daily basis and this past week it has been accompanied by nausea and one episode of nonbloody, nonbilious emesis.  No obvious triggers. Pain not exacerbated by laying flat. She also reports migraines on a weekly basis, often multiple episodes per week.  She is found that the only effective remedy has been Excedrin, which she takes as prescribed.  She denies alcohol or tobacco use.  She took Tums this past weekend which seemed to help with her epigastric pain.  She also reports tarry black stools for the past month.  She has never seen a gastroenterologist and she has never been on PPIs.  She denies any recent illness, cough, HA, blurred vision, back pain, dizziness, shortness of breath, chest pain, sore throat, difficulty swallowing, fever, or chills.  She denies active epigastric pain or nausea.    HPI  Past Medical History:  Diagnosis Date   Abnormal Pap smear    Anxiety    Headache    migraines   Heart murmur    infant, no complications   Hypertension     Patient Active Problem List   Diagnosis Date Noted   Mastitis, obstetric, delivered with postpartum condition 06/02/2013   Benign essential hypertension with delivery 06/02/2013   Hypertension in pregnancy, preeclampsia, severe, antepartum  05/27/2013   Migraine without aura 12/17/2012   Chronic hypertension in pregnancy 12/17/2012   Supervision of other high-risk pregnancy 12/17/2012    Past Surgical History:  Procedure Laterality Date   EYE SURGERY     age 62   LAPAROSCOPIC BILATERAL SALPINGECTOMY Bilateral 05/17/2014   Procedure: LAPAROSCOPIC BILATERAL SALPINGECTOMY;  Surgeon: Allie Bossier, MD;  Location: [facility];  Service: Gynecology;  Laterality: Bilateral;   WISDOM TOOTH EXTRACTION       OB History    Gravida  3   Para  3   Term  2   Preterm  1   AB      Living  3     SAB      TAB      Ectopic      Multiple      Live Births  3            Home Medications    Prior to Admission medications   Medication Sig Start Date End Date Taking? Authorizing Provider  aspirin-acetaminophen-caffeine (EXCEDRIN MIGRAINE) [phone] MG per tablet Take 2 tablets by mouth every 6 (six) hours as needed for headache or migraine.    Yes [provider]  ferrous sulfate 325 (65 FE) MG tablet Take 1 tablet (325 mg total) by mouth daily. 11/23/18   Lorelee New, PA-C  omeprazole (PRILOSEC) 40 MG capsule Take 1 capsule (40 mg total) by mouth daily. 11/23/18   Lorelee New,  PA-C  sucralfate (CARAFATE) 1 g tablet Take 1 tablet (1 g total) by mouth 4 (four) times daily -  with meals and at bedtime for 3 days. 11/23/18 11/26/18  Lorelee NewGreen, Abraham Margulies L, PA-C    Family History Family History  Problem Relation Age of Onset   Hypertension Mother    Hypertension Father    Diabetes Maternal Aunt    Glaucoma Maternal Grandmother     Social History Social History   Tobacco Use   Smoking status: Never Smoker   Smokeless tobacco: Never Used  Substance Use Topics   Alcohol use: No   Drug use: No     Allergies   Patient has no known allergies.   Review of Systems Review of Systems Ten systems are reviewed and are negative for acute change except as noted in the HPI   Physical Exam Updated  Vital Signs BP (!) 150/114 (BP Location: Right Arm)    Pulse 83    Temp 98.6 F (37 C) (Oral)    Resp 20    SpO2 100%   Physical Exam Constitutional:      Appearance: Normal appearance.  HENT:     Head: Normocephalic and atraumatic.     Mouth/Throat:     Mouth: Mucous membranes are moist.     Pharynx: Oropharynx is clear.  Eyes:     Pupils: Pupils are equal, round, and reactive to light.  Cardiovascular:     Rate and Rhythm: Normal rate and regular rhythm.     Heart sounds: Normal heart sounds.  Pulmonary:     Effort: Pulmonary effort is normal.     Breath sounds: Normal breath sounds.  Abdominal:     General: Abdomen is flat. There is no distension.     Palpations: Abdomen is soft. There is no mass.     Tenderness: There is no abdominal tenderness. There is no guarding.  Skin:    General: Skin is warm and dry.  Neurological:     Mental Status: She is alert and oriented to person, place, and time.  Psychiatric:        Mood and Affect: Mood normal.        Behavior: Behavior normal.        Thought Content: Thought content normal.      ED Treatments / Results  Labs (all labs ordered are listed, but only abnormal results are displayed) Labs Reviewed  BASIC METABOLIC PANEL - Abnormal; Notable for the following components:      Result Value   CO2 21 (*)    All other components within normal limits  CBC - Abnormal; Notable for the following components:   RBC 3.66 (*)    Hemoglobin 7.8 (*)    HCT 27.9 (*)    MCV 76.2 (*)    MCH 21.3 (*)    MCHC 28.0 (*)    RDW 17.2 (*)    Platelets 627 (*)    All other components within normal limits  I-STAT BETA HCG BLOOD, ED ([facility], [facility], [facility] ONLY)  POC OCCULT BLOOD, ED    EKG EKG Interpretation  Date/Time:  Tuesday November 23 2018 15:48:22 EDT Ventricular Rate:  79 PR Interval:  142 QRS Duration: 74 QT Interval:  370 QTC Calculation: 424 R Axis:   100 Text Interpretation:  Normal sinus rhythm with sinus arrhythmia Rightward  axis Rightward axis Artifact Abnormal ECG Confirmed by Gerhard MunchLockwood, Robert [phone]) on 11/23/2018 10:06:50 PM   Radiology Dg Chest 2 View  Result  Date: 11/23/2018 CLINICAL DATA:  Acute chest pain. EXAM: CHEST - 2 VIEW COMPARISON:  None. FINDINGS: The cardiomediastinal silhouette is unremarkable. There is no evidence of focal airspace disease, pulmonary edema, suspicious pulmonary nodule/mass, pleural effusion, or pneumothorax. No acute bony abnormalities are identified. IMPRESSION: No active cardiopulmonary disease. Electronically Signed   By: Margarette Canada M.D.   On: 11/23/2018 16:13    Procedures Procedures (including critical care time)  Medications Ordered in ED Medications  sodium chloride flush (NS) 0.9 % injection 3 mL (has no administration in time range)     Initial Impression / Assessment and Plan / ED Course  I have reviewed the triage vital signs and the nursing notes.  Pertinent labs & imaging results that were available during my care of the patient were reviewed by me and considered in my medical decision making (see chart for details).        Patient's history, physical exam, and labs are consistent with a microcytic anemia, possibly secondary to small upper GI bleed. While patient does not smoke or drink alcohol, patient is at risk due to frequent Excedrin use for migraines in conjunction with her ongoing, untreated epigastric pain consistent with reflux. Despite reports of tarry, black stools, her fecal occult was negative here today. Patient's frequent burning epigastric pain radiating into throat that is improved with Tums is consistent with reflux disease. Given that this happens on a daily basis, it can be characterized as moderate to severe reflux and we will treat with Prilosec. Will prescribe Carafate for bridge relief in interim. Interpreted CBC as a microcytic anemia and will prescribe ferrous sulfate. Chest x-ray and EKG reviewed and interpreted as unremarkable.  Considered pancreatitis given epigastric pain, but given burning character and relieved with Tums as well as radiation towards throat rather than back, it was lowered on my differential. Blood pressure mildly elevated in the ER and patient is tolerating p.o. well so did not give IV fluids.  Encouraged increased hydration and a high-fiber diet given the and supplementation.  Patient denied any nausea or ongoing epigastric pain while in her care.  She also no longer felt faint and denied any dizziness.  Patient was ready to go home and we felt comfortable discharging.  Patient is understanding and agreeable to plan.  She will follow-up with her PCP at her scheduled appointment 28 September.  Discussed possibility of GI referral if epigastric pain and melena continues. Also discussed future H. Pylori testing if epigastric pain is not resolved with PPI alone.    Final Clinical Impressions(s) / ED Diagnoses   Final diagnoses:  Gastroesophageal reflux disease, esophagitis presence not specified  Microcytic anemia    ED Discharge Orders         Ordered    ferrous sulfate 325 (65 FE) MG tablet  Daily     11/23/18 2318    omeprazole (PRILOSEC) 40 MG capsule  Daily     11/23/18 2318    sucralfate (CARAFATE) 1 g tablet  3 times daily with meals & bedtime     11/23/18 2318           Corena Herter, PA-C 11/23/18 2330    Carmin Muskrat, MD 11/24/18 0002    Corena Herter, PA-C 11/24/18 0015    Carmin Muskrat, MD 11/26/18 [phone]

## 2018-11-23 NOTE — ED Notes (Signed)
Patient Alert and oriented to baseline. Stable and ambulatory to baseline. Patient verbalized understanding of the discharge instructions.  Patient belongings were taken by the patient.   

## 2020-09-01 IMAGING — DX DG CHEST 2V
2 series · 2 of 2 positions shown · non-contrast
Comparison: None.

CLINICAL DATA: Acute chest pain.

EXAM:
CHEST - 2 VIEW

[w chest pa]
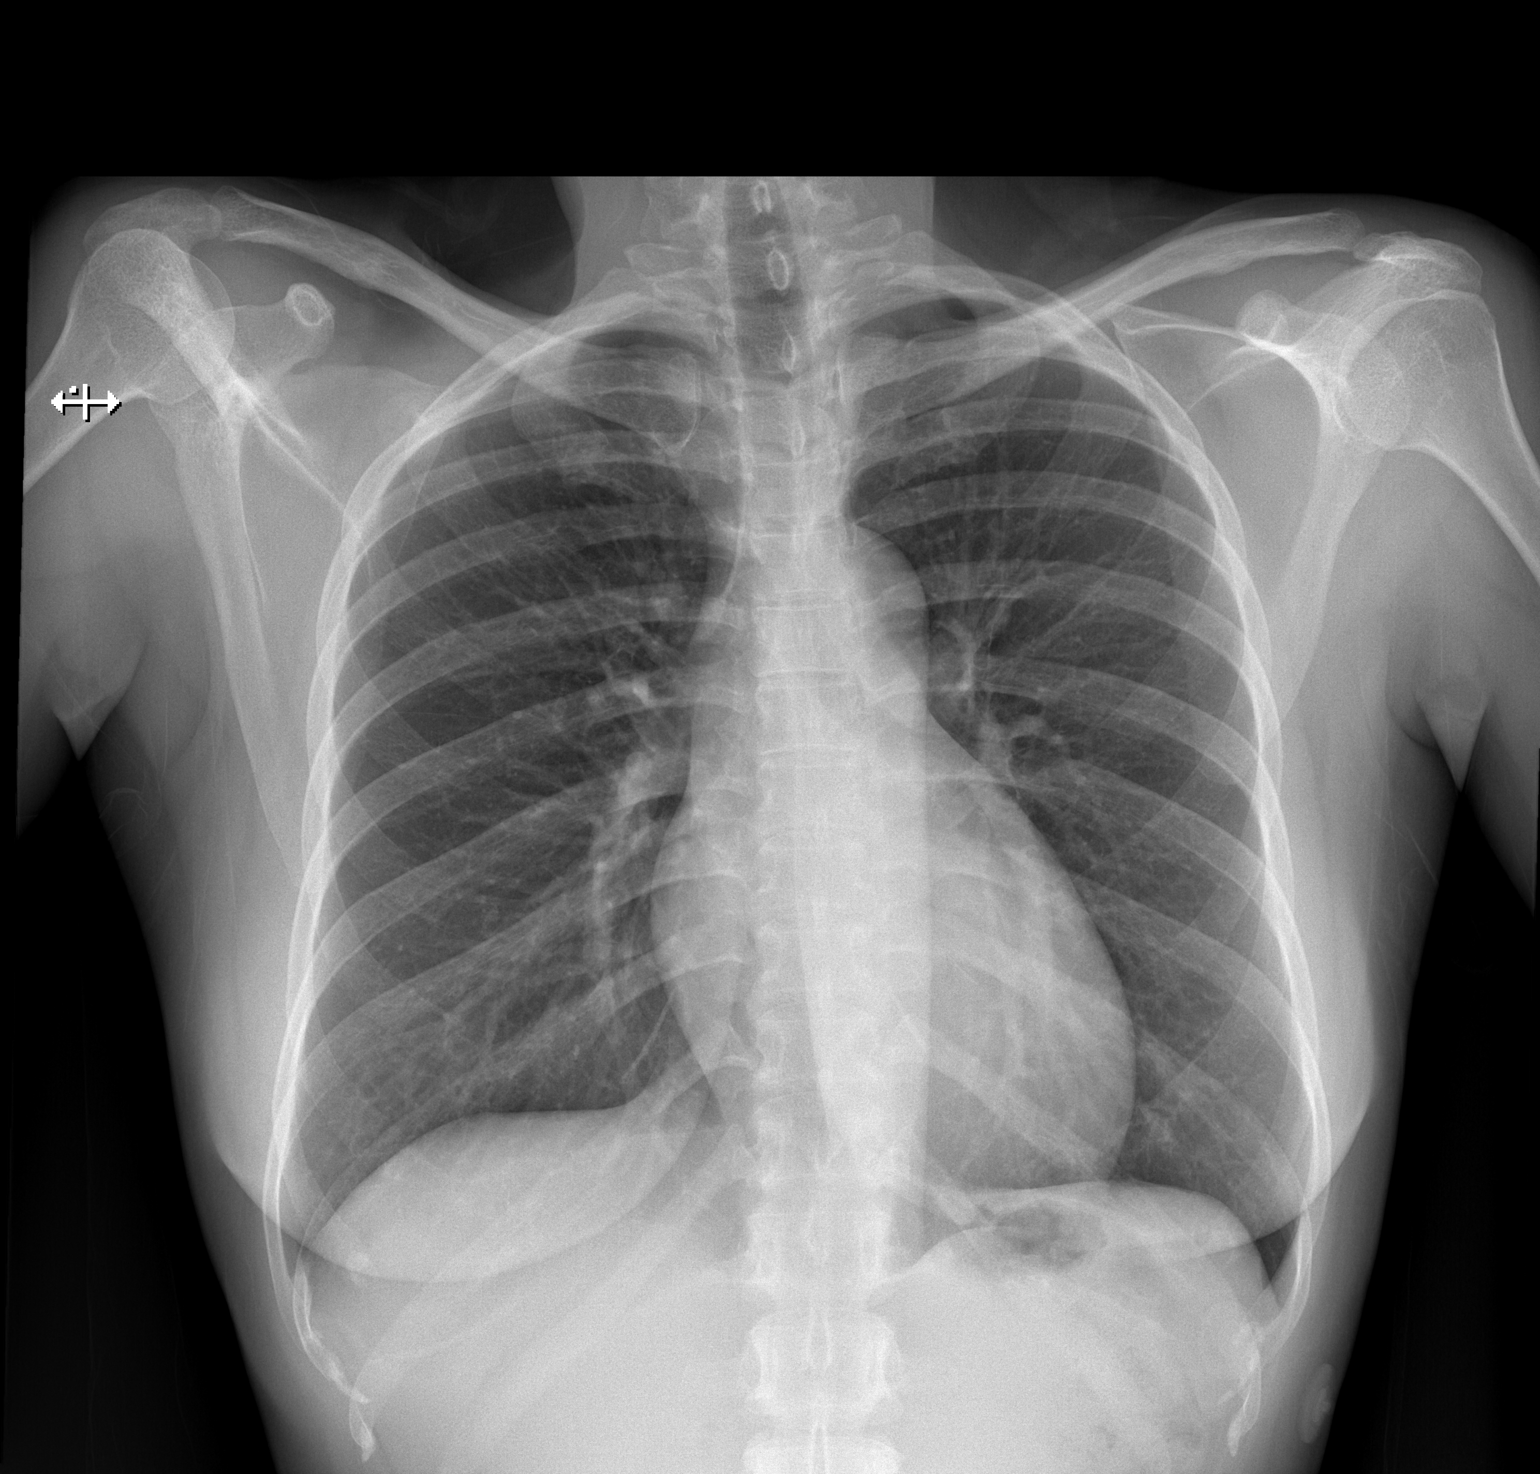

[w chest lat]
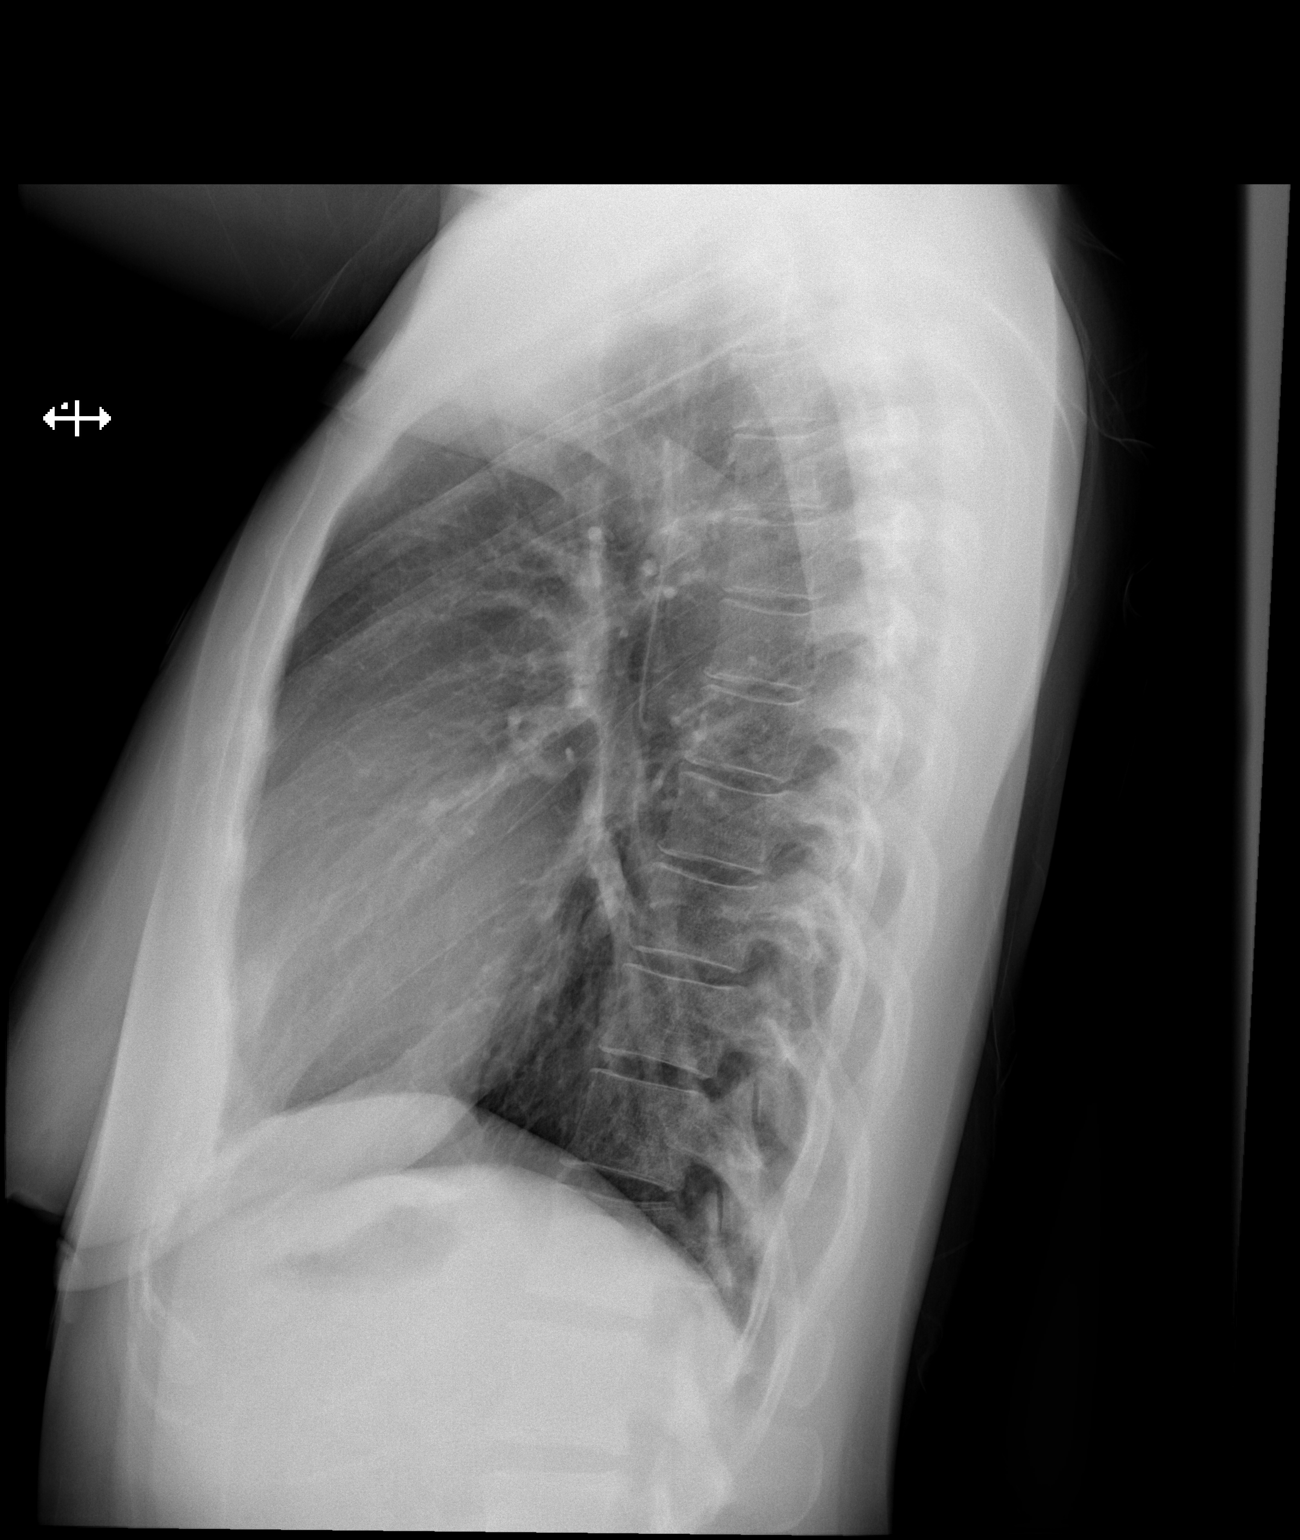

[2 of 2 positions shown; findings below may reference images not displayed]

FINDINGS: The cardiomediastinal silhouette is unremarkable.

There is no evidence of focal airspace disease, pulmonary edema,
suspicious pulmonary nodule/mass, pleural effusion, or pneumothorax.

No acute bony abnormalities are identified.
IMPRESSION: No active cardiopulmonary disease.

## 2021-04-28 ENCOUNTER — Other Ambulatory Visit: Payer: Self-pay

## 2021-04-28 ENCOUNTER — Encounter (HOSPITAL_COMMUNITY): Payer: Self-pay | Admitting: Emergency Medicine

## 2021-04-28 ENCOUNTER — Emergency Department (HOSPITAL_COMMUNITY)
Admission: EM | Admit: 2021-04-28 | Discharge: 2021-04-28 | Disposition: A | Discharge status: Home / Self Care | Payer: Medicaid Other | Attending: Emergency Medicine | Admitting: Emergency Medicine

## 2021-04-28 DIAGNOSIS — Z23 Encounter for immunization: Secondary | ICD-10-CM | POA: Insufficient documentation

## 2021-04-28 DIAGNOSIS — Z7982 Long term (current) use of aspirin: Secondary | ICD-10-CM | POA: Insufficient documentation

## 2021-04-28 DIAGNOSIS — S6992XA Unspecified injury of left wrist, hand and finger(s), initial encounter: Secondary | ICD-10-CM | POA: Diagnosis present

## 2021-04-28 DIAGNOSIS — W268XXA Contact with other sharp object(s), not elsewhere classified, initial encounter: Secondary | ICD-10-CM | POA: Insufficient documentation

## 2021-04-28 DIAGNOSIS — S61211A Laceration without foreign body of left index finger without damage to nail, initial encounter: Secondary | ICD-10-CM

## 2021-04-28 DIAGNOSIS — S61221A Laceration with foreign body of left index finger without damage to nail, initial encounter: Secondary | ICD-10-CM | POA: Diagnosis not present

## 2021-04-28 MED ORDER — LIDOCAINE HCL (PF) 1 % IJ SOLN
5.0000 mL | Freq: Once | INTRAMUSCULAR | Status: AC
Start: 2021-04-28 — End: 2021-04-28
  Administered 2021-04-28: 5 mL
  Filled 2021-04-28: qty 5

## 2021-04-28 MED ORDER — TETANUS-DIPHTH-ACELL PERTUSSIS 5-2.5-18.5 LF-MCG/0.5 IM SUSY
0.5000 mL | PREFILLED_SYRINGE | Freq: Once | INTRAMUSCULAR | Status: AC
Start: 2021-04-28 — End: 2021-04-28
  Administered 2021-04-28: 0.5 mL via INTRAMUSCULAR
  Filled 2021-04-28: qty 0.5

## 2021-04-28 NOTE — ED Notes (Signed)
Lacerated  lt index finger cut with a tin can

## 2021-04-28 NOTE — ED Provider Notes (Signed)
[facility] EMERGENCY DEPARTMENT Provider Note   CSN: [id number] Arrival date & time: 04/28/21  2140     History  Chief Complaint  Patient presents with   Laceration    Mackinzi Goble is a 36 y.o. female.  HPI Patient is a 36 year old female who presents to the emergency department with a laceration to the left index finger.  This occurred just prior to arrival.  Patient states she was opening a can of corn, slipped, and cut the affected finger on the rim of the can.  Bleeding controlled with direct pressure.  States her tetanus was last updated greater than 10 years ago.  Denies any numbness or weakness.    Home Medications Prior to Admission medications   Medication Sig Start Date End Date Taking? Authorizing Provider  aspirin-acetaminophen-caffeine (EXCEDRIN MIGRAINE) [phone] MG per tablet Take 2 tablets by mouth every 6 (six) hours as needed for headache or migraine.     [provider]  ferrous sulfate 325 (65 FE) MG tablet Take 1 tablet (325 mg total) by mouth daily. 11/23/18   Corena Herter, PA-C  omeprazole (PRILOSEC) 40 MG capsule Take 1 capsule (40 mg total) by mouth daily. 11/23/18   Corena Herter, PA-C  sucralfate (CARAFATE) 1 g tablet Take 1 tablet (1 g total) by mouth 4 (four) times daily -  with meals and at bedtime for 3 days. 11/23/18 11/26/18  Corena Herter, PA-C      Allergies    Patient has no known allergies.    Review of Systems   Review of Systems  Musculoskeletal:  Positive for arthralgias and myalgias.  Skin:  Positive for wound.  Neurological:  Negative for weakness and numbness.   Physical Exam Updated Vital Signs BP (!) 138/105 (BP Location: Right Arm)    Pulse 85    Temp 98.8 F (37.1 C) (Oral)    Resp 16    SpO2 98%  Physical Exam Vitals and nursing note reviewed.  Constitutional:      General: She is not in acute distress.    Appearance: She is well-developed.  HENT:     Head: Normocephalic and atraumatic.      Right Ear: External ear normal.     Left Ear: External ear normal.  Eyes:     General: No scleral icterus.       Right eye: No discharge.        Left eye: No discharge.     Conjunctiva/sclera: Conjunctivae normal.  Neck:     Trachea: No tracheal deviation.  Cardiovascular:     Rate and Rhythm: Normal rate.  Pulmonary:     Effort: Pulmonary effort is normal. No respiratory distress.     Breath sounds: No stridor.  Abdominal:     General: There is no distension.  Musculoskeletal:        General: No swelling or deformity.     Cervical back: Neck supple.  Skin:    General: Skin is warm and dry.     Findings: No rash.     Comments: 2.5 cm well approximated laceration along the dorsal aspect of the PIP of the left index finger.  Small amount of bleeding that improved with direct pressure.  Distal sensation intact.  Good cap refill.  2+ radial pulses.  Full active and passive range of motion of the finger.  Neurological:     General: No focal deficit present.     Mental Status: She is alert and  oriented to person, place, and time.     Cranial Nerves: Cranial nerve deficit: no gross deficits.   ED Results / Procedures / Treatments   Labs (all labs ordered are listed, but only abnormal results are displayed) Labs Reviewed - No data to display  EKG None  Radiology No results found.  Procedures .Marland KitchenLaceration Repair  Date/Time: 04/28/2021 10:56 PM Performed by: Rayna Sexton, PA-C Authorized by: Rayna Sexton, PA-C   Consent:    Consent obtained:  Verbal   Consent given by:  Patient   Risks discussed:  Infection, need for additional repair, pain, poor cosmetic result and poor wound healing   Alternatives discussed:  No treatment and delayed treatment Universal protocol:    Procedure explained and questions answered to patient or proxy's satisfaction: yes     Relevant documents present and verified: yes     Test results available: yes     Imaging studies available: yes      Required blood products, implants, devices, and special equipment available: yes     Site/side marked: yes     Immediately prior to procedure, a time out was called: yes     Patient identity confirmed:  Verbally with patient Anesthesia:    Anesthesia method:  Local infiltration   Local anesthetic:  Lidocaine 1% w/o epi Laceration details:    Location: Left index finger.   Length (cm):  2.5 Pre-procedure details:    Preparation:  Patient was prepped and draped in usual sterile fashion Exploration:    Limited defect created (wound extended): no     Wound exploration: wound explored through full range of motion     Contaminated: no   Treatment:    Amount of cleaning:  Extensive   Irrigation solution:  Tap water   Irrigation method:  Pressure wash   Visualized foreign bodies/material removed: no   Skin repair:    Repair method:  Sutures   Suture size:  5-0   Suture material:  Prolene   Suture technique:  Simple interrupted   Number of sutures:  3 Approximation:    Approximation:  Close Repair type:    Repair type:  Simple Post-procedure details:    Dressing:  Antibiotic ointment and sterile dressing   Procedure completion:  Tolerated well, no immediate complications    Medications Ordered in ED Medications  Tdap (BOOSTRIX) injection 0.5 mL (has no administration in time range)  lidocaine (PF) (XYLOCAINE) 1 % injection 5 mL (has no administration in time range)   ED Course/ Medical Decision Making/ A&P                           Medical Decision Making Risk Prescription drug management.  Patient is a 36 year old left-hand-dominant female who presents to the emergency department due to a laceration of the left index finger.  This occurred about 30 minutes prior to arrival.  Patient was opening a can of corn and slipped and struck the finger on the sharp edge of the can.  On my exam patient has about a 2.5 cm well approximated laceration along the dorsal aspect of the  left index finger overlying the PIP.  Mild amount of bleeding that improved with direct pressure.  Neurovascularly intact distal to the injury.  Full range of motion of the finger.  2+ radial pulses.  Wound was cleaned extensively and closed with Prolene sutures.  Please see procedure note above for additional information.  Tdap was updated in  the ED.  Recommended suture removal in 7 to 10 days.  Feel the patient is stable for discharge at this time and she is agreeable.  We discussed return precautions.  Her questions were answered and she was amicable at the time of discharge. Final Clinical Impression(s) / ED Diagnoses Final diagnoses:  Laceration of left index finger without damage to nail, foreign body presence unspecified, initial encounter   Rx / DC Orders ED Discharge Orders     None         Rayna Sexton, PA-C 04/28/21 2259    Davonna Belling, MD 04/29/21 0021

## 2021-04-28 NOTE — Discharge Instructions (Addendum)
I have attached information in this paperwork on how to care for your laceration.  Please continue to apply bacitracin to the wound 1-2 times per day.  Please keep it covered during the day to help keep it clean.  Please have your stitches removed in 7 to 10 days.  If you develop any new or worsening symptoms such as worsening pain, swelling, redness, discharge from the wound, fevers, nausea, vomiting, or generally worsening symptoms, please come back to the emergency department immediately for reevaluation.

## 2021-04-28 NOTE — ED Triage Notes (Signed)
Pt presents with lac to left index finger from opening a can of corn. Bleeding controlled at this time. Tetanus >10 years ago.
